# Patient Record
Sex: Male | Born: 1935 | Race: Black or African American | Hispanic: No | Smoking: Former smoker
Health system: Southern US, Community
[De-identification: ages and names within clinical notes are randomized; demographics above are authoritative.]

## PROBLEM LIST (undated history)

## (undated) DIAGNOSIS — I639 Cerebral infarction, unspecified: Secondary | ICD-10-CM

## (undated) DIAGNOSIS — N400 Enlarged prostate without lower urinary tract symptoms: Secondary | ICD-10-CM

## (undated) DIAGNOSIS — H409 Unspecified glaucoma: Secondary | ICD-10-CM

## (undated) DIAGNOSIS — I10 Essential (primary) hypertension: Secondary | ICD-10-CM

## (undated) DIAGNOSIS — J439 Emphysema, unspecified: Secondary | ICD-10-CM

## (undated) DIAGNOSIS — J449 Chronic obstructive pulmonary disease, unspecified: Secondary | ICD-10-CM

---

## 1998-12-03 ENCOUNTER — Ambulatory Visit (HOSPITAL_BASED_OUTPATIENT_CLINIC_OR_DEPARTMENT_OTHER): Admission: RE | Admit: 1998-12-03 | Discharge: 1998-12-03 | Payer: Self-pay | Admitting: Ophthalmology

## 1998-12-18 ENCOUNTER — Encounter: Payer: Self-pay | Admitting: Ophthalmology

## 1998-12-20 ENCOUNTER — Ambulatory Visit (HOSPITAL_COMMUNITY): Admission: RE | Admit: 1998-12-20 | Discharge: 1998-12-20 | Payer: Self-pay | Admitting: Ophthalmology

## 2000-04-02 ENCOUNTER — Ambulatory Visit (HOSPITAL_COMMUNITY): Admission: RE | Admit: 2000-04-02 | Discharge: 2000-04-02 | Payer: Self-pay | Admitting: Family Medicine

## 2000-04-02 ENCOUNTER — Encounter: Payer: Self-pay | Admitting: Family Medicine

## 2001-09-02 ENCOUNTER — Encounter: Payer: Self-pay | Admitting: Emergency Medicine

## 2001-09-02 ENCOUNTER — Emergency Department (HOSPITAL_COMMUNITY): Admission: EM | Admit: 2001-09-02 | Discharge: 2001-09-02 | Payer: Self-pay | Admitting: Emergency Medicine

## 2002-01-05 ENCOUNTER — Ambulatory Visit (HOSPITAL_COMMUNITY): Admission: RE | Admit: 2002-01-05 | Discharge: 2002-01-05 | Payer: Self-pay | Admitting: Family Medicine

## 2002-01-05 ENCOUNTER — Encounter: Payer: Self-pay | Admitting: Family Medicine

## 2003-04-05 ENCOUNTER — Encounter: Payer: Self-pay | Admitting: Family Medicine

## 2003-04-05 ENCOUNTER — Ambulatory Visit (HOSPITAL_COMMUNITY): Admission: RE | Admit: 2003-04-05 | Discharge: 2003-04-05 | Payer: Self-pay | Admitting: Family Medicine

## 2003-10-10 ENCOUNTER — Inpatient Hospital Stay (HOSPITAL_COMMUNITY): Admission: EM | Admit: 2003-10-10 | Discharge: 2003-10-15 | Payer: Self-pay | Admitting: Emergency Medicine

## 2003-10-10 ENCOUNTER — Encounter: Admission: RE | Admit: 2003-10-10 | Discharge: 2003-10-15 | Payer: Self-pay | Admitting: Neurology

## 2003-10-11 ENCOUNTER — Encounter: Payer: Self-pay | Admitting: Cardiology

## 2003-10-19 ENCOUNTER — Encounter: Admission: RE | Admit: 2003-10-19 | Discharge: 2003-11-14 | Payer: Self-pay | Admitting: Emergency Medicine

## 2003-12-25 ENCOUNTER — Encounter: Admission: RE | Admit: 2003-12-25 | Discharge: 2004-03-14 | Payer: Self-pay | Admitting: Pediatrics

## 2004-08-01 ENCOUNTER — Emergency Department (HOSPITAL_COMMUNITY): Admission: EM | Admit: 2004-08-01 | Discharge: 2004-08-01 | Payer: Self-pay | Admitting: Family Medicine

## 2004-12-26 ENCOUNTER — Ambulatory Visit (HOSPITAL_COMMUNITY): Admission: RE | Admit: 2004-12-26 | Discharge: 2004-12-26 | Payer: Self-pay | Admitting: Family Medicine

## 2005-12-23 ENCOUNTER — Ambulatory Visit (HOSPITAL_COMMUNITY): Admission: RE | Admit: 2005-12-23 | Discharge: 2005-12-23 | Payer: Self-pay | Admitting: Family Medicine

## 2006-05-04 ENCOUNTER — Emergency Department (HOSPITAL_COMMUNITY): Admission: EM | Admit: 2006-05-04 | Discharge: 2006-05-04 | Payer: Self-pay | Admitting: Family Medicine

## 2006-06-22 ENCOUNTER — Inpatient Hospital Stay (HOSPITAL_COMMUNITY): Admission: EM | Admit: 2006-06-22 | Discharge: 2006-06-24 | Payer: Self-pay | Admitting: Emergency Medicine

## 2007-05-13 ENCOUNTER — Ambulatory Visit: Payer: Self-pay | Admitting: Internal Medicine

## 2007-05-24 ENCOUNTER — Ambulatory Visit (HOSPITAL_COMMUNITY): Admission: RE | Admit: 2007-05-24 | Discharge: 2007-05-24 | Payer: Self-pay | Admitting: Internal Medicine

## 2007-07-19 ENCOUNTER — Ambulatory Visit: Payer: Self-pay | Admitting: Pulmonary Disease

## 2007-09-09 ENCOUNTER — Emergency Department (HOSPITAL_COMMUNITY): Admission: EM | Admit: 2007-09-09 | Discharge: 2007-09-09 | Payer: Self-pay | Admitting: Emergency Medicine

## 2007-09-20 ENCOUNTER — Ambulatory Visit: Payer: Self-pay | Admitting: Internal Medicine

## 2007-09-20 DIAGNOSIS — R0602 Shortness of breath: Secondary | ICD-10-CM

## 2007-09-20 DIAGNOSIS — E785 Hyperlipidemia, unspecified: Secondary | ICD-10-CM

## 2007-09-20 DIAGNOSIS — I10 Essential (primary) hypertension: Secondary | ICD-10-CM | POA: Insufficient documentation

## 2007-09-20 DIAGNOSIS — I639 Cerebral infarction, unspecified: Secondary | ICD-10-CM

## 2007-09-27 ENCOUNTER — Telehealth: Payer: Self-pay | Admitting: Internal Medicine

## 2007-10-04 ENCOUNTER — Telehealth (INDEPENDENT_AMBULATORY_CARE_PROVIDER_SITE_OTHER): Payer: Self-pay | Admitting: *Deleted

## 2007-10-25 ENCOUNTER — Telehealth (INDEPENDENT_AMBULATORY_CARE_PROVIDER_SITE_OTHER): Payer: Self-pay | Admitting: *Deleted

## 2007-11-08 ENCOUNTER — Ambulatory Visit: Payer: Self-pay | Admitting: Internal Medicine

## 2007-11-08 DIAGNOSIS — J441 Chronic obstructive pulmonary disease with (acute) exacerbation: Secondary | ICD-10-CM | POA: Insufficient documentation

## 2008-01-04 ENCOUNTER — Ambulatory Visit (HOSPITAL_COMMUNITY): Admission: RE | Admit: 2008-01-04 | Discharge: 2008-01-04 | Payer: Self-pay | Admitting: Family Medicine

## 2008-01-18 ENCOUNTER — Emergency Department (HOSPITAL_COMMUNITY): Admission: EM | Admit: 2008-01-18 | Discharge: 2008-01-18 | Payer: Self-pay | Admitting: Emergency Medicine

## 2008-05-08 ENCOUNTER — Encounter: Payer: Self-pay | Admitting: Internal Medicine

## 2008-06-05 ENCOUNTER — Emergency Department (HOSPITAL_COMMUNITY): Admission: EM | Admit: 2008-06-05 | Discharge: 2008-06-05 | Payer: Self-pay | Admitting: Emergency Medicine

## 2008-08-01 IMAGING — CR DG CHEST 2V
2 series · 2 of 2 positions shown · non-contrast
Comparison: 09/09/07.

CLINICAL DATA: Shortness of breath. 
 CHEST - 2 VIEW:

[w chest pa]
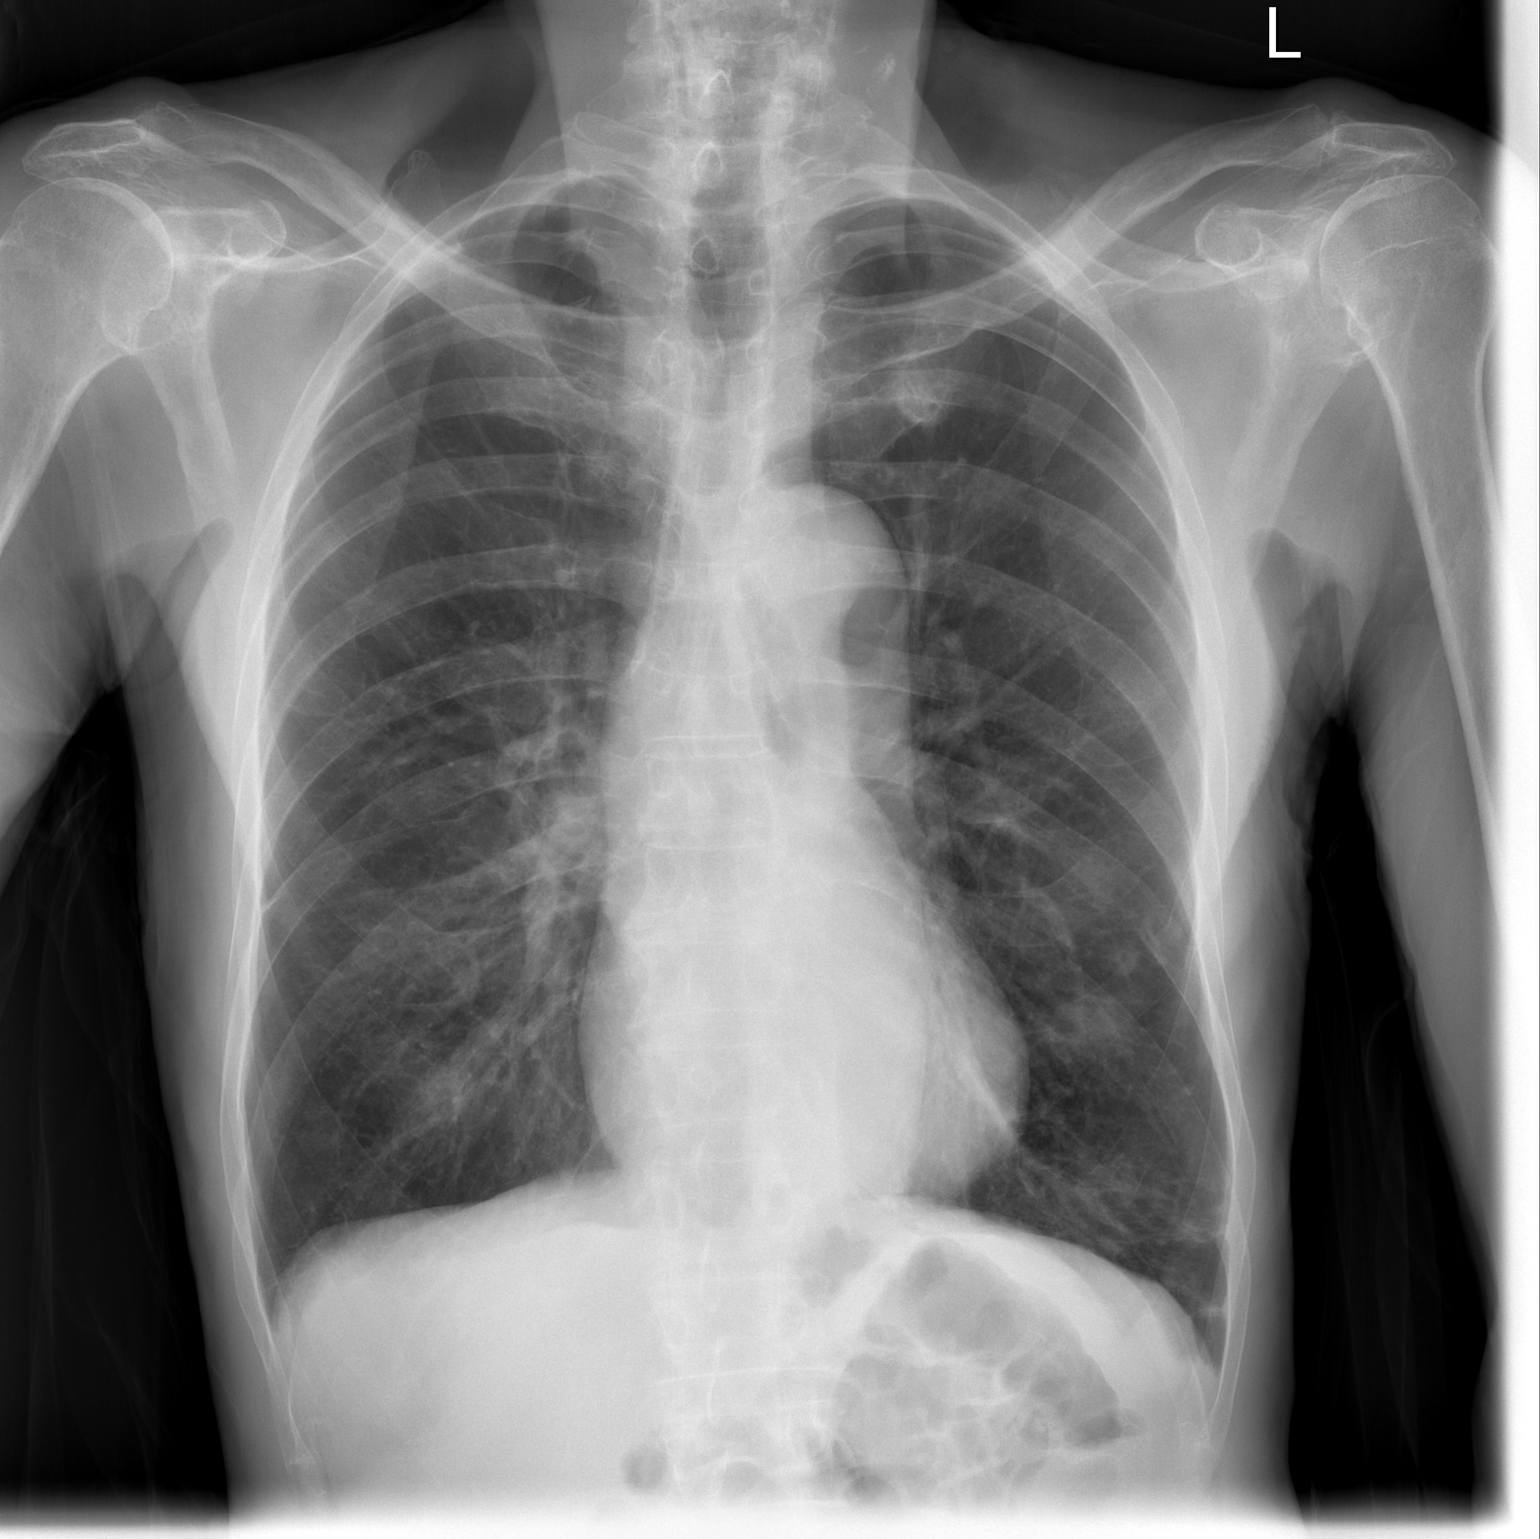

[w chest lat]
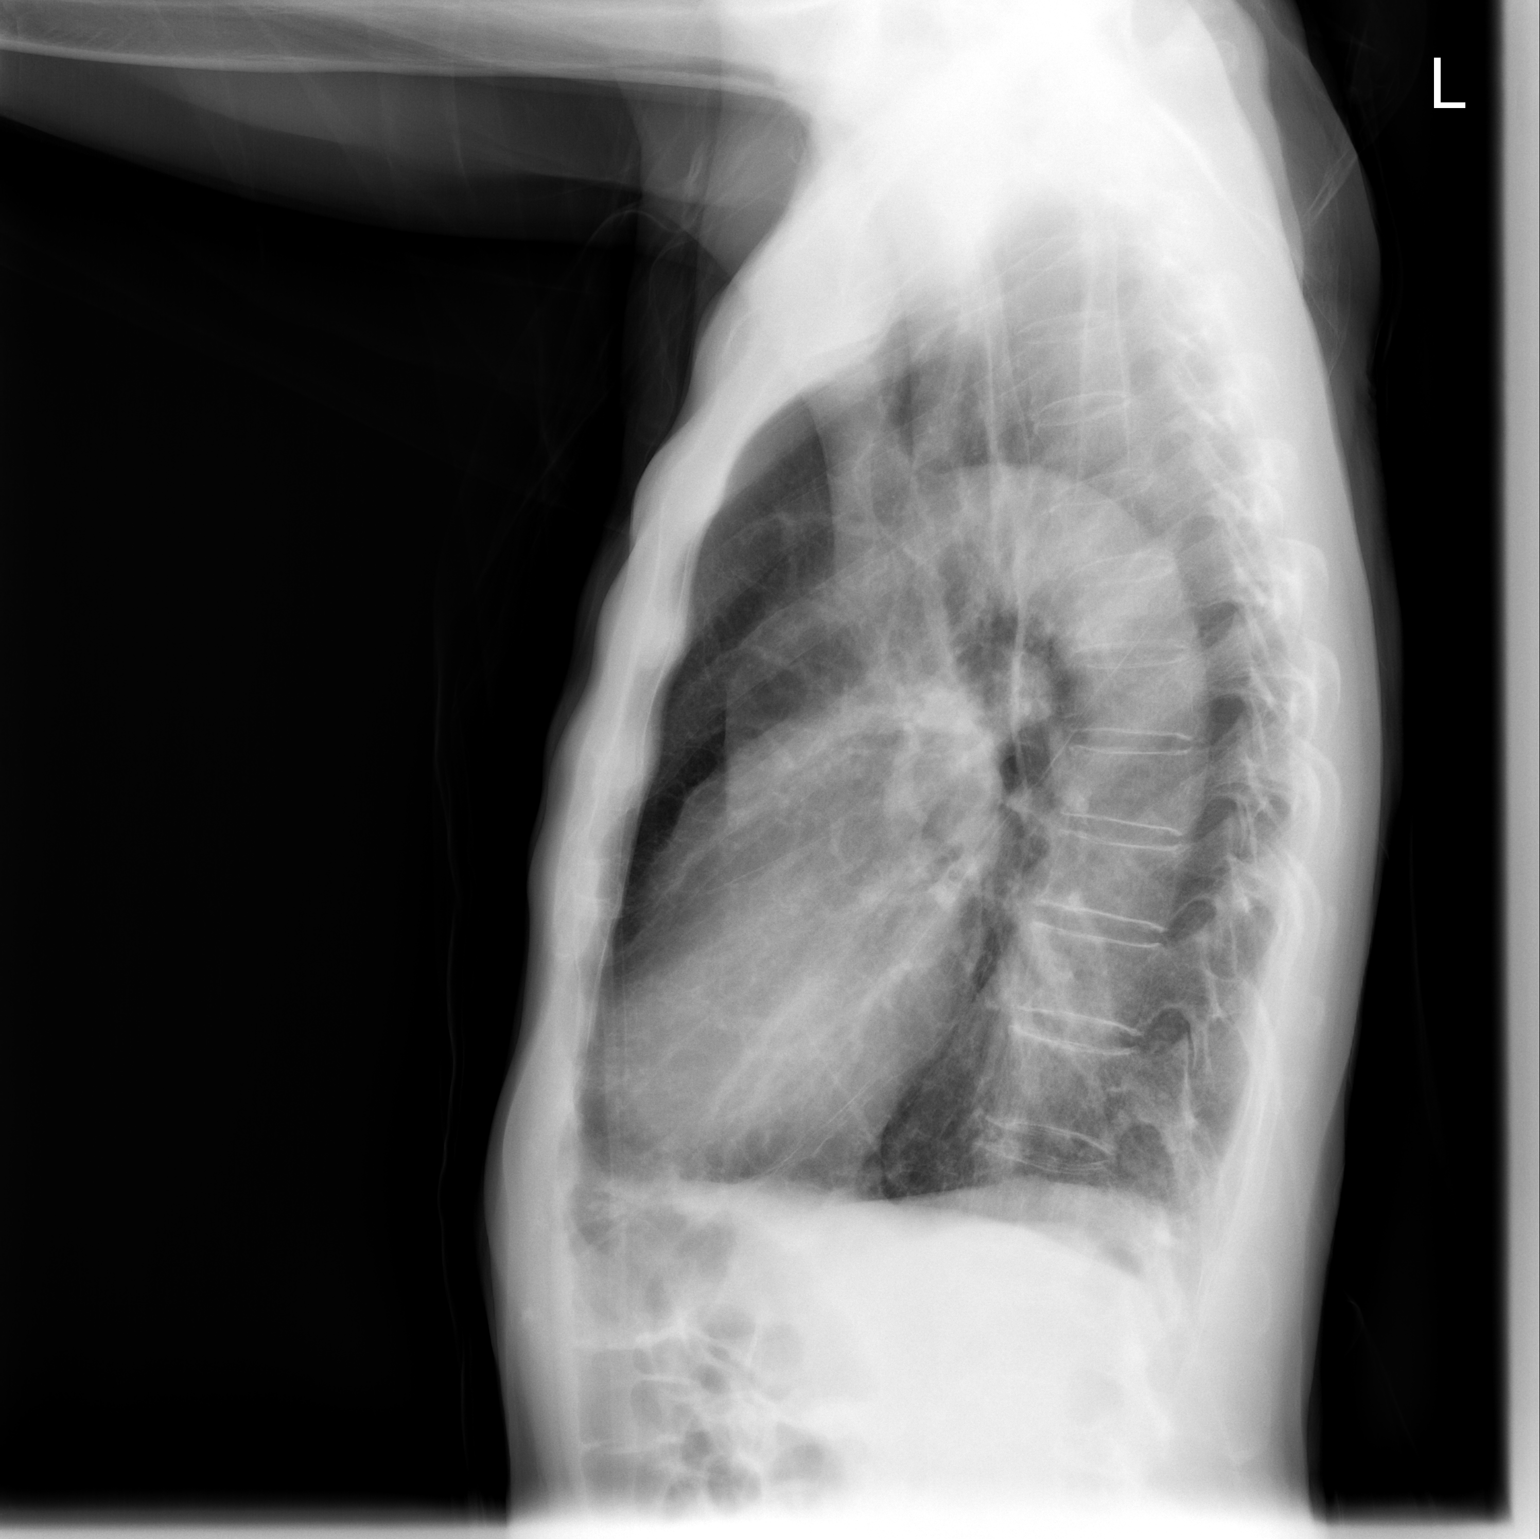

[2 of 2 positions shown; findings below may reference images not displayed]

FINDINGS: Changes of COPD are again noted.  Scarring is again seen in the left lung base.  There is no evidence of acute infiltrate or edema.  There is no evidence of pleural effusion.  Heart size normal.   No mass or adenopathy identified.
IMPRESSION: COPD and left lower lobe scarring.  No acute findings.

## 2008-12-19 ENCOUNTER — Ambulatory Visit (HOSPITAL_COMMUNITY): Admission: RE | Admit: 2008-12-19 | Discharge: 2008-12-19 | Payer: Self-pay | Admitting: Family Medicine

## 2009-08-26 ENCOUNTER — Emergency Department (HOSPITAL_COMMUNITY): Admission: EM | Admit: 2009-08-26 | Discharge: 2009-08-26 | Payer: Self-pay | Admitting: Emergency Medicine

## 2009-09-08 ENCOUNTER — Emergency Department (HOSPITAL_COMMUNITY): Admission: EM | Admit: 2009-09-08 | Discharge: 2009-09-08 | Payer: Self-pay | Admitting: Emergency Medicine

## 2009-11-22 ENCOUNTER — Emergency Department (HOSPITAL_COMMUNITY): Admission: EM | Admit: 2009-11-22 | Discharge: 2009-11-22 | Payer: Self-pay | Admitting: Emergency Medicine

## 2009-12-06 ENCOUNTER — Emergency Department (HOSPITAL_COMMUNITY): Admission: EM | Admit: 2009-12-06 | Discharge: 2009-12-06 | Payer: Self-pay | Admitting: Emergency Medicine

## 2010-02-21 ENCOUNTER — Emergency Department (HOSPITAL_COMMUNITY): Admission: EM | Admit: 2010-02-21 | Discharge: 2010-02-21 | Payer: Self-pay | Admitting: Emergency Medicine

## 2010-03-29 ENCOUNTER — Inpatient Hospital Stay (HOSPITAL_COMMUNITY): Admission: EM | Admit: 2010-03-29 | Discharge: 2010-03-31 | Payer: Self-pay | Admitting: Emergency Medicine

## 2010-12-29 LAB — CBC
HCT: 45.8 % (ref 39.0–52.0)
Hemoglobin: 12.8 g/dL — ABNORMAL LOW (ref 13.0–17.0)
Hemoglobin: 14.6 g/dL (ref 13.0–17.0)
MCHC: 31.1 g/dL (ref 30.0–36.0)
MCHC: 31.8 g/dL (ref 30.0–36.0)
MCHC: 32.3 g/dL (ref 30.0–36.0)
MCV: 90.7 fL (ref 78.0–100.0)
Platelets: 171 10*3/uL (ref 150–400)
Platelets: 173 10*3/uL (ref 150–400)
RBC: 5.05 MIL/uL (ref 4.22–5.81)
RDW: 15.2 % (ref 11.5–15.5)

## 2010-12-29 LAB — DIFFERENTIAL
Basophils Absolute: 0 10*3/uL (ref 0.0–0.1)
Basophils Relative: 0 % (ref 0–1)
Basophils Relative: 0 % (ref 0–1)
Eosinophils Absolute: 0.1 10*3/uL (ref 0.0–0.7)
Eosinophils Relative: 4 % (ref 0–5)
Lymphocytes Relative: 8 % — ABNORMAL LOW (ref 12–46)
Lymphs Abs: 0.8 10*3/uL (ref 0.7–4.0)
Monocytes Absolute: 0.1 10*3/uL (ref 0.1–1.0)
Monocytes Absolute: 0.4 10*3/uL (ref 0.1–1.0)
Monocytes Relative: 11 % (ref 3–12)
Monocytes Relative: 2 % — ABNORMAL LOW (ref 3–12)
Neutro Abs: 1.9 10*3/uL (ref 1.7–7.7)
Neutro Abs: 4.4 10*3/uL (ref 1.7–7.7)
Neutro Abs: 7 10*3/uL (ref 1.7–7.7)
Neutrophils Relative %: 80 % — ABNORMAL HIGH (ref 43–77)

## 2010-12-29 LAB — BASIC METABOLIC PANEL
CO2: 26 mEq/L (ref 19–32)
Chloride: 106 mEq/L (ref 96–112)
GFR calc Af Amer: >60 mL/min (ref >60)
Glucose, Bld: 129 mg/dL — ABNORMAL HIGH (ref 70–99)
Sodium: 141 mEq/L (ref 135–145)

## 2010-12-29 LAB — BLOOD GAS, ARTERIAL
Acid-Base Excess: 2.4 mmol/L — ABNORMAL HIGH (ref 0.0–2.0)
Bicarbonate: 26 mEq/L — ABNORMAL HIGH (ref 20.0–24.0)
O2 Content: 2 L/min
O2 Saturation: 95.7 %
Patient temperature: 98.6
pO2, Arterial: 73.6 mmHg — ABNORMAL LOW (ref 80.0–100.0)

## 2010-12-29 LAB — COMPREHENSIVE METABOLIC PANEL
ALT: 19 U/L (ref 0–53)
Albumin: 3.5 g/dL (ref 3.5–5.2)
Alkaline Phosphatase: 82 U/L (ref 39–117)
BUN: 23 mg/dL (ref 6–23)
CO2: 25 mEq/L (ref 19–32)
Calcium: 8.9 mg/dL (ref 8.4–10.5)
Chloride: 108 mEq/L (ref 96–112)
Creatinine, Ser: 1.29 mg/dL (ref 0.4–1.5)
GFR calc non Af Amer: 53 mL/min — ABNORMAL LOW (ref >60)
Glucose, Bld: 103 mg/dL — ABNORMAL HIGH (ref 70–99)
Glucose, Bld: 158 mg/dL — ABNORMAL HIGH (ref 70–99)
Potassium: 4.3 mEq/L (ref 3.5–5.1)
Sodium: 139 mEq/L (ref 135–145)
Total Bilirubin: 1.1 mg/dL (ref 0.3–1.2)
Total Protein: 7.2 g/dL (ref 6.0–8.3)

## 2010-12-29 LAB — LIPID PANEL
Cholesterol: 121 mg/dL (ref 0–200)
HDL: 96 mg/dL (ref >39)
LDL Cholesterol: 14 mg/dL (ref 0–99)
Total CHOL/HDL Ratio: 1.3 RATIO

## 2010-12-29 LAB — POCT CARDIAC MARKERS
CKMB, poc: 1.1 ng/mL (ref 1.0–8.0)
Myoglobin, poc: 88.7 ng/mL (ref 12–200)
Troponin i, poc: <0.05 ng/mL (ref 0.00–0.09)

## 2010-12-29 LAB — PROTIME-INR
INR: 1 (ref 0.00–1.49)
Prothrombin Time: 13.1 seconds (ref 11.6–15.2)

## 2010-12-29 LAB — MAGNESIUM: Magnesium: 2 mg/dL (ref 1.5–2.5)

## 2011-01-02 LAB — CBC
HCT: 42.7 % (ref 39.0–52.0)
MCHC: 33.1 g/dL (ref 30.0–36.0)
MCHC: 33.2 g/dL (ref 30.0–36.0)
MCV: 91.8 fL (ref 78.0–100.0)
RBC: 4.66 MIL/uL (ref 4.22–5.81)
RBC: 5.09 MIL/uL (ref 4.22–5.81)
RDW: 14.1 % (ref 11.5–15.5)

## 2011-01-02 LAB — URINALYSIS, ROUTINE W REFLEX MICROSCOPIC
Glucose, UA: NEGATIVE mg/dL
Specific Gravity, Urine: 1.014 (ref 1.005–1.030)
Urobilinogen, UA: 0.2 mg/dL (ref 0.0–1.0)

## 2011-01-02 LAB — BASIC METABOLIC PANEL
CO2: 24 mEq/L (ref 19–32)
Chloride: 102 mEq/L (ref 96–112)
Creatinine, Ser: 1.4 mg/dL (ref 0.4–1.5)
GFR calc Af Amer: 58 mL/min — ABNORMAL LOW (ref >60)
Potassium: 4.6 mEq/L (ref 3.5–5.1)

## 2011-01-02 LAB — COMPREHENSIVE METABOLIC PANEL
ALT: 18 U/L (ref 0–53)
AST: 24 U/L (ref 0–37)
Alkaline Phosphatase: 66 U/L (ref 39–117)
CO2: 29 mEq/L (ref 19–32)
Calcium: 9.5 mg/dL (ref 8.4–10.5)
GFR calc Af Amer: >60 mL/min (ref >60)
Potassium: 3.9 mEq/L (ref 3.5–5.1)
Sodium: 137 mEq/L (ref 135–145)
Total Protein: 7.6 g/dL (ref 6.0–8.3)

## 2011-01-02 LAB — DIFFERENTIAL
Basophils Relative: 0 % (ref 0–1)
Eosinophils Absolute: 0.1 10*3/uL (ref 0.0–0.7)
Eosinophils Absolute: 0.1 10*3/uL (ref 0.0–0.7)
Eosinophils Relative: 1 % (ref 0–5)
Eosinophils Relative: 1 % (ref 0–5)
Lymphs Abs: 0.7 10*3/uL (ref 0.7–4.0)
Monocytes Absolute: 0.5 10*3/uL (ref 0.1–1.0)
Monocytes Absolute: 0.6 10*3/uL (ref 0.1–1.0)
Monocytes Relative: 11 % (ref 3–12)
Monocytes Relative: 9 % (ref 3–12)
Neutrophils Relative %: 61 % (ref 43–77)

## 2011-01-02 LAB — URINE MICROSCOPIC-ADD ON

## 2011-01-02 LAB — URINE CULTURE

## 2011-01-02 LAB — GLUCOSE, CAPILLARY

## 2011-01-15 LAB — DIFFERENTIAL
Basophils Absolute: 0 10*3/uL (ref 0.0–0.1)
Lymphocytes Relative: 12 % (ref 12–46)
Monocytes Absolute: 0.6 10*3/uL (ref 0.1–1.0)
Monocytes Relative: 7 % (ref 3–12)
Neutro Abs: 6.7 10*3/uL (ref 1.7–7.7)
Neutrophils Relative %: 80 % — ABNORMAL HIGH (ref 43–77)

## 2011-01-15 LAB — CBC
HCT: 44.2 % (ref 39.0–52.0)
Hemoglobin: 14.9 g/dL (ref 13.0–17.0)
MCHC: 33.6 g/dL (ref 30.0–36.0)
MCV: 89.7 fL (ref 78.0–100.0)
Platelets: 211 10*3/uL (ref 150–400)
RDW: 14.7 % (ref 11.5–15.5)

## 2011-01-15 LAB — URINALYSIS, ROUTINE W REFLEX MICROSCOPIC
Glucose, UA: NEGATIVE mg/dL
Ketones, ur: NEGATIVE mg/dL
Protein, ur: NEGATIVE mg/dL
Urobilinogen, UA: 0.2 mg/dL (ref 0.0–1.0)

## 2011-01-15 LAB — URINE CULTURE

## 2011-01-15 LAB — COMPREHENSIVE METABOLIC PANEL
Albumin: 4.2 g/dL (ref 3.5–5.2)
BUN: 12 mg/dL (ref 6–23)
Creatinine, Ser: 1.08 mg/dL (ref 0.4–1.5)
Glucose, Bld: 127 mg/dL — ABNORMAL HIGH (ref 70–99)
Total Protein: 7.2 g/dL (ref 6.0–8.3)

## 2011-01-15 LAB — POCT CARDIAC MARKERS
CKMB, poc: 1.8 ng/mL (ref 1.0–8.0)
Myoglobin, poc: 121 ng/mL (ref 12–200)
Troponin i, poc: <0.05 ng/mL (ref 0.00–0.09)

## 2011-01-15 LAB — URINE MICROSCOPIC-ADD ON

## 2011-01-23 LAB — COMPREHENSIVE METABOLIC PANEL
ALT: 16 U/L (ref 0–53)
AST: 26 U/L (ref 0–37)
Albumin: 4.2 g/dL (ref 3.5–5.2)
Alkaline Phosphatase: 69 U/L (ref 39–117)
Chloride: 104 mEq/L (ref 96–112)
GFR calc Af Amer: >60 mL/min (ref >60)
Potassium: 3.5 mEq/L (ref 3.5–5.1)
Sodium: 141 mEq/L (ref 135–145)
Total Bilirubin: 2.4 mg/dL — ABNORMAL HIGH (ref 0.3–1.2)
Total Protein: 7.2 g/dL (ref 6.0–8.3)

## 2011-01-23 LAB — CBC
HCT: 44.7 % (ref 39.0–52.0)
Platelets: 154 10*3/uL (ref 150–400)
RDW: 13.7 % (ref 11.5–15.5)
WBC: 4.2 10*3/uL (ref 4.0–10.5)

## 2011-01-23 LAB — LIPID PANEL
LDL Cholesterol: 42 mg/dL (ref 0–99)
Total CHOL/HDL Ratio: 1.8 RATIO
VLDL: 14 mg/dL (ref 0–40)

## 2011-01-23 LAB — TSH: TSH: 1.284 u[IU]/mL (ref 0.350–4.500)

## 2011-02-25 NOTE — Assessment & Plan Note (Signed)
[facility]                             PULMONARY OFFICE NOTE   Jesus Carey, Jesus Carey                      MRN:          [id number]  DATE:05/13/2007                            DOB:          [birth year]    PROBLEM:  This is an 77 year old man referred through the courtesy of  Dr. Bruna Potter in pulmonary consultation concerned about shortness of  breath.   HISTORY:  He comes with his granddaughter and together they say that he  has shortness of breath which varies from day-to-day, but is made worse  by current hot weather. There is usually some cough and white sputum,  but it does not wake him. There is no chest pain. He has been using an  inhaler every two weeks over the past few months, and was hospitalized  for breathing problems at [facility] earlier in the year. They gave him  Advair in a metered inhaler, but he stopped using the Advair.   MEDICATIONS:  1. Aspirin 81 mg.  2. Lisinopril/hydrochlorothiazide 20/12.5.  3. Amlodipine 5 mg.  4. Crestor 10 mg.  5. Plavix 75 mg.  6. Xalatan eye drops.  7. Albuterol inhaler p.r.n.   ALLERGIES:  No medication allergy.   REVIEW OF SYSTEMS:  Dyspnea at rest and with exertion. No chest pain.  Weight has been stable. No bloody or purulent sputum. Some joint  stiffness, non-specific. No adenopathy or rash. Feet do not swell.   PAST HISTORY:  Hypertension, elevated cholesterol, stroke. He denies any  history of pneumonia, tuberculosis exposure, operations, or anemia. No  heart disease.   SOCIAL HISTORY:  He quit smoking 20 years ago. He is widowed. He lives  alone. 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   FAMILY HISTORY:  He is not aware of detail, but says that nobody has  significant breathing problems.   OBJECTIVE:  VITAL SIGNS:  Weight 96 pounds, blood pressure 102/66, pulse  97, room air saturation 95%.  GENERAL:  This is a slim, comfortable appearing,  elderly man.  SKIN:  No rash.  ADENOPATHY:  None found.  HEENT:  Mild rhonchi across the mid-zones, unlabored without cough or  wheeze.  HEART:  Sounds are regular without murmur or gallop.  ABDOMEN:  I cannot feel liver or spleen.  EXTREMITIES:  There is no cyanosis, clubbing, or peripheral edema.   IMPRESSION:  This is probably chronic obstructive pulmonary disease,  complicated by age related weakness, but I am concerned that he is using  up an albuterol inhaler every 2 weeks which is too fast for that drug.   PLAN:  1. We discussed the appropriate use of albuterol and side effects.  2. He is given a trial of Spiriva once daily.  3. We are scheduling chest x-ray and pulmonary function tests.  4. Schedule return in 1 month, earlier p.r.n.     Clinton D. Maple Hudson, MD, Tonny Bollman, FACP  Electronically Signed    CDY/MedQ  DD: 05/15/2007  DT: 05/16/2007  Job #: [id number]   cc:   Britt Bottom  Bruna Potter, MD

## 2011-02-25 NOTE — Assessment & Plan Note (Signed)
[facility]                             PULMONARY OFFICE NOTE   CARRINGTON, OLAZABAL                      MRN:          [id number]  DATE:09/20/2007                            DOB:          1920-02-28    HISTORY OF PRESENT ILLNESS:  The patient is an 77 year old African  American male, a patient of Dr. Maple Hudson, who has a known history of  moderate to severe COPD that presents today for followup.  The patient  was seen in the office 2 months ago with persistent cough.  The patient  had been recommended to change over from his ACE inhibitor to Benicar.  The patient reports that his cough did improve.  The patient reports his  breathing actually improved on Symbicort; however, the patient did not  follow up for return on 2 weeks as recommended.  The patient did feel  like the Symbicort did help his breathing quite a bit and actually had  decreased his albuterol use.  Recently the patient had a flare up 2  weeks ago on Thanksgiving day and was seen in the emergency room for an  exacerbation.  He was given a prednisone pack and a nebulizer treatment.  The patient reports symptoms did improve.  And, now he is back to his  baseline in which he has shortness of breath with activity and uses  albuterol several times a day.  The patient denies any chest pain,  palpitations, orthopnea, PND, or leg swelling.   PAST MEDICAL HISTORY:  Reviewed.   CURRENT MEDICATIONS:  Reviewed.   PHYSICAL EXAMINATION:  GENERAL:  The patient is a very thin black male  in no acute distress.  VITAL SIGNS:  He is afebrile with stable vital signs.  O2 saturation is  98% on room air.  HEENT:  Unremarkable.  NECK:  Supple without cervical adenopathy.  No JVD.  RESPIRATORY:  Lung sounds reveal a few scattered rhonchi.  CARDIAC:  Regular rate and rhythm.  ABDOMEN:  Soft and nontender.  EXTREMITIES:  Warm without any edema.   IMPRESSION:  Moderate to severe chronic obstructive pulmonary  disease  with an asthmatic bronchitic component.   PLAN:  1. The patient is recommended to restart Symbicort 80/4.5 two puffs      twice a day, sample and prescription were given.  Instructions were      given to the patient and his daughter today.  2. The patient also is given Ventolin HFA inhaler to have on use with      the instructions that we      hope to decrease the use of rescue inhaler requirements.  3. The patient will return back with Dr. Maple Hudson in 1 month or sooner if      needed.      Rubye Oaks, NP  Electronically Signed      Clinton D. Maple Hudson, MD, Tonny Bollman, FACP  Electronically Signed   TP/MedQ  DD: 09/20/2007  DT: 09/20/2007  Job #: [id number]

## 2011-02-25 NOTE — Assessment & Plan Note (Signed)
[facility]                             PULMONARY OFFICE NOTE   ORLANDER, NORWOOD                      MRN:          [id number]  DATE:07/19/2007                            DOB:          1920/07/30    HISTORY OF PRESENT ILLNESS:  The patient is an 77 year old African  American male, patient of Dr. Maple Hudson, who was recently seen in July, for  a pulmonary consult for persistent shortness of breath.  The patient was  felt to have probable COPD, was set up for pulmonary function tests  which revealed an FEV1 of 1.04 liters which was 52% of predicted.  The  patient was recommended to use Spiriva.  However, the patient only took  his sample dose and has not taken any since.  The patient continues to  have increased shortness of breath with activity, persistent cough and  wheezing.  The patient denies any purulent sputum, hemoptysis,  orthopnea, PND, or leg swelling.  The patient did quit smoking  approximately 20 years ago.  A chest x-ray showed COPD changes without  acute infiltrate or effusion.   PAST MEDICAL HISTORY:  Reviewed.   CURRENT MEDICATIONS:  Reviewed.   PHYSICAL EXAMINATION:  GENERAL:  The patient is a very thin, frail,  elderly male in no acute distress.  VITAL SIGNS:  He is afebrile with stable vital signs.  O2 saturation is  98% on room air.  HEENT:  Unremarkable.  NECK:  Supple without cervical adenopathy.  No JVD.  RESPIRATORY:  Lung sounds reveal faint expiratory wheezes with decreased  breath sounds at the bases.  The patient has significant upper airway  pseudo-wheezing.  CARDIAC:  Regular rate and rhythm.  ABDOMEN:  Soft, nontender.  No palpable hepatosplenomegaly.  EXTREMITIES:  Warm without any calf tenderness, cyanosis, clubbing, or  edema.   DATA:  PFTs on May 24, 2007, revealed an FEV1 of 1.04 liters which  was 52% of predicted.  Post bronchodilator was 1.17 liters which showed  a 13% change.  The patient had decreased  mid flows at 27% and a  diffusing capacity at 31%.   IMPRESSION AND PLAN:  Moderate to severe chronic obstructive pulmonary  disease with persistent cough.  The patient is recommended to begin  Symbicort 80/4.5 mcg two puffs twice a day, add Mucinex DM twice a day.  The patient is on an ACE inhibitor which could be irritating his airway  and causing persistent cough.  I have recommended that he stop his  Lisinopril and  begin Benicar HCT 20/12.5 mg one by mouth daily.  The patient will  return back here in 2 weeks or sooner if needed.      Rubye Oaks, NP  Electronically Signed      Clinton D. Maple Hudson, MD, Tonny Bollman, FACP  Electronically Signed   TP/MedQ  DD: 07/19/2007  DT: 07/19/2007  Job #: [id number]

## 2011-02-28 NOTE — Discharge Summary (Signed)
NAME:  Jesus Carey, Jesus Carey NO.:  [id number]   MEDICAL RECORD NO.:  [id number]          PATIENT TYPE:  INP   LOCATION:  6733                         FACILITY:  [facility]   PHYSICIAN:  Mobolaji B. Bakare, M.D.DATE OF BIRTH:  May 15, 1920   DATE OF ADMISSION:  06/22/2006  DATE OF DISCHARGE:  06/24/2006                                 DISCHARGE SUMMARY   PRIMARY CARE PHYSICIAN:  Renaye Rakers, M.D.   FINAL DIAGNOSES:  1. Chronic obstructive pulmonary disease exacerbation.  2. Hypertension.  3. Hyperlipidemia.  4. Old left brain stroke.  5. Mild thrombocytopenia.   PROCEDURES:  Chest x-ray compatible with COPD, no active cardiopulmonary  disease.   BRIEF HISTORY:  [name] is a pleasant 77 year old African-American male  who resides alone and has been functioning quite well at home.  He is  independent.  He has a history of COPD but has not been using inhalers and  the patient developed acute onset of shortness of breath on the day of  admission.  He called EMS.  He was given nebulizer treatment en route to the  hospital, IV steroid, and this has improved significantly on arrival in the  emergency room.  He has been having chronic nagging cough.  There was no  fever or chills.  Cough not productive of sputum.   Initial vitals on admission:  Blood pressure 146/93, heart rate of 82,  respiratory rate of 30, O2 saturation of 98% on 2 L.  On examination, the  patient was not in respiratory distress.  Lung exam was remarkable for  reduced breath sounds throughout both lung fields but no active wheeze.  The  rest of physical exam was unremarkable.   The patient was admitted for COPD exacerbation, nebulizer treatment.   HOSPITAL COURSE:  Problem 1.  CHRONIC OBSTRUCTIVE PULMONARY DISEASE EXACERBATION:  Apparently  from history, Mr. Severin appears to have been stable for some time with  respect to the COPD.  He has not needed to use his albuterol MDI in recent  times.  The  patient improved remarkably within the first 24 hours since  evaluation in the emergency room.  He was admitted to ensure stability and  he was placed on Advair Diskus one puff b.i.d. and Spiriva 18 mcg daily.  He  was also nebulized with albuterol and Atrovent p.r.n.  The patient remained  afebrile and white cell count is normal.  He is deemed fit to be discharged  home on this medications.  He was not treated with IV steroid nor  antibiotic.   Problem 2.  HYPERTENSION:  Blood pressure was uncontrolled just prior to  initiating 5 mg of Norvasc.  His home dose of Norvasc is uncertain.  The  patient was on lisinopril at home; however, that dose is unknown and his  blood pressure was elevated while only on Norvasc; hence, Avapro was given  to control blood pressure.  On discharge the patient will continue with same  dose of lisinopril as he was using before.  Blood pressure on admission  142/78.  He still has room to accommodate  Norvasc 10 mg daily should this be  his home dose of medication.   Problem 3.  MILD THROMBOCYTOPENIA:  Platelets on admission was 150.  This  dropped slightly to 140s.  Lovenox for DVT prophylaxis was held.  He was  placed on PAS hose and platelets at the time of discharge was 138.  Follow-  up CBC to be done in outpatient setting by primary care physician.   The patient was seen by physical and occupational therapy.  He did not need  any of their services.  The patient ambulating well without need to stop to  catch his breath.  His oxygen did not drop during ambulation.   The patient lives alone.  He appears to be comfortable returning home.  He  has been independent.  There is some concern about his medications and being  able to use them appropriately.  I have arranged home health RN to follow up  with patient to ensure safety and medication use.   DISCHARGE MEDICATIONS:  1. Advair one puff b.i.d.  2. Megace 200 mg daily.  3. Spiriva 18 mcg daily.  4.  Albuterol inhaler as needed.  5. Lisinopril to use as before.  6. Norvasc 5 mg daily.  7. Aspirin 81 mg daily.  8. Crestor to use as before.  9. Plavix 75 mg daily.   DISCHARGE CONDITION:  Stable.  Vitals:  Temperature 97.9, heart rate 68,  blood pressure 142/78, O2 saturation was 97% on room air.   Follow up with Dr. Renaye Rakers next week.  Home health RN has been arranged  to follow up the patient.  Check CBC in one week.      Mobolaji B. Corky Downs, M.D.  Electronically Signed     MBB/MEDQ  D:  06/24/2006  T:  06/24/2006  Job:  [id number]   cc:   Renaye Rakers, M.D.

## 2011-02-28 NOTE — H&P (Signed)
NAME:  Jesus Carey, Jesus Carey NO.:  [id number]   MEDICAL RECORD NO.:  [id number]          PATIENT TYPE:  EMS   LOCATION:  MAJO                         FACILITY:  [facility]   PHYSICIAN:  Lonia Blood, M.D.DATE OF BIRTH:  April 27, 1920   DATE OF ADMISSION:  06/22/2006  DATE OF DISCHARGE:                                HISTORY & PHYSICAL   PRIMARY CARE PHYSICIAN:  Unassigned.   CHIEF COMPLAINT:  Shortness of breath.   HISTORY OF PRESENT ILLNESS:  Mr. Lanard Arguijo is a very pleasant 77-year-  old male who lives alone here in [place].  He has a longstanding history  of COPD per old records.  He apparently did discontinue tobacco abuse  approximately 20 years ago.  Mr. Faivre was in his usual state of health,  when he suffered the acute onset of severe shortness of breath this evening.  He reports that he does have frequent spells at home of difficulty with his  breathing, but they usually resolve on their own.  He states that he was  given an albuterol MDI years ago, but that he has never used it at home.  It appears that he has little insight into his illness.  Tonight, however,  his shortness of breath became worrisome enough that he summoned EMS for  evaluation.  EMS found the patient to be in acute respiratory distress.  He  was given a nebulizer treatment on the way to the hospital and then in the  emergency room, he required IV magnesium, high-dose IV steroids and a 10-mg  continuous albuterol nebulizer to break his severe bronchospasm.  At present  he reports that his breathing feels better.  He denies fever or chills.  He  does admit to a chronic nagging dry cough.  He admits to weight loss since  his wife died approximately 2 years ago.  He reports that he has a good  appetite and that he does eat, though this is questionable.  He denies  hemoptysis.   REVIEW OF SYSTEMS:  Comprehensive review of systems is unremarkable with the  exception of the positive  elements noted in the history of present illness  above.   PAST MEDICAL HISTORY:  (Per old records.)  1. Hypertension.  2. Hyperlipidemia.  3. COPD.  4. Remote history of tobacco abuse, discontinued greater than 20 years      ago.  5. History of left brain CVA, January 2005, with no long-term deficits      with the exception to mild right hand clumsiness and loss of fine motor      movement in the right hand.  6. Glaucoma.   OUTPATIENT MEDICATIONS:  1. Norvasc 5 mg daily.  2. Aspirin daily.  3. Crestor -- dose unknown.  4. Lisinopril -- dose unknown.  5. Plavix 75 mg daily.   ALLERGIES:  No known drug allergies.   FAMILY HISTORY:  Noncontributory secondary to advanced age.   SOCIAL HISTORY:  The patient does not drink.  He does not currently work.  He states that he has done a whole bunch of different  jobs.  He denies  prolonged exposure to cotton dust.  He presently lives alone; he is a  widower.   DATA REVIEW:  Hemoglobin, white count, platelet count and MCV are normal.  Sodium, potassium, chloride, bicarb, BUN and creatinine are normal.  Serum  glucose is elevated at 121.  The pH is 7.33, PCO2 is 43, PO2 was not  obtained on a venous gas.   Chest x-ray reveals definite change consistent with COPD, but no evidence of  a focal infiltrate.   PHYSICAL EXAM:  VITAL SIGNS:  Afebrile, blood pressure 146/93, heart rate  82, respiratory rate 30, O2 SAT 98% on 2 L/min nasal cannula.  GENERAL:  A thin, frail-appearing male who is finishing up a continuous  albuterol treatment and appears to be breathing comfortably at the present  time.  HEENT:  Normocephalic, atraumatic.  Pupils equal, round and reactive to  light and accommodation.  NECK:  No JVD.  LUNGS:  Markedly distant breath sounds throughout all fields with barely  audible inspiratory sounds, but no active wheeze at the present time.  CARDIOVASCULAR:  Markedly distant heart sounds without gallop or rub and no   appreciable murmur.  ABDOMEN:  Thin, soft.  Bowel sounds present.  No mass.  No rebound.  EXTREMITIES:  No significant cyanosis, clubbing or edema in bilateral lower  extremities.  NEUROLOGIC:  Alert and oriented, though slow to respond to some questions  and somewhat lethargic, moves all 4 extremities spontaneously.   IMPRESSION AND PLAN:  1. Acute chronic obstructive pulmonary disease exacerbation:  The exact      etiology of Mr. Farve acute chronic obstructive pulmonary disease      exacerbation is not clear.  While it does appear that he has resolved      his acute symptoms for the present time, I feel that he is at      exceedingly high risk to be returned to a home setting where he lives      alone.  I am concerned about this gentleman's ability to live on his      own to begin with and feel that to send him home now at such a high      risk for recurrence of his bronchospasm would significantly endanger      his life.  I will therefore admit him to the hospital for further      evaluation.  I will continue inhaled beta agonists as well as Atrovent.      We will use inhaled steroids.  I will not continue systemic steroid, as      it does not appear to be required at the present time.  After the      patient is hydrated, we will follow up a chest x-ray to assure there is      no new evidence of an infiltrate.  The patient is no longer smoking.  2. Hyperglycemia:  The patient has an elevated capillary blood glucose at      121.  He has no prior history of diabetes mellitus.  This may be      related to medications given in the emergency room.  We will check a      capillary blood glucose on a twice-daily basis initially and follow the      trend.  We will also check a hemoglobin A1c.  3. Hypertension:  The patient's blood pressure is currently reasonably      well-controlled.  We will  continue his Norvasc and lisinopril and     follow his vitals closely.  4. Chronic obstructive  pulmonary disease:  It appears that the patient has      very poor understanding of his illness.  After his acute phase is      confirmed to have resolved, we will need to place him on standing      medications.  I would favor Advair for this gentleman.  We will need to      educate him on his illness and assure that he is capable of      administering his own medications.  5. Failure to thrive:  Mr. Rawles reports that he has been continually      losing weight for 2 years.  He has somewhat of a dry cough.  I will      check an albumin on this gentleman, as I am concerned about protein      calorie malnutrition.  If it is low, we will need to consider CT scan      of the chest to rule out an occult mass in the setting of a remote      history of tobacco abuse and otherwise unexplained weight loss in a      gentleman with a dry      persistent cough.  Otherwise, I will also consult Physical Therapy,      Occupational Therapy and Case Management to see if we can assist this      gentleman in his current living situation.  We may need to consider an      alternate living arrangement to best suit Mr. Mcconathy.      Lonia Blood, M.D.  Electronically Signed     JTM/MEDQ  D:  06/22/2006  T:  06/22/2006  Job:  [id number]

## 2011-02-28 NOTE — Procedures (Signed)
This is a 17-channel EEG with one channel devoted to EKG utilizing  international 10/20 lead placement.  The patient was described clinically as  being awake and drowsy.  Scintigraphically appeared to be awake throughout  the study.  The study is being done to evaluate a left brain stroke.  He was  having some focal jerking on the right hand, rule out seizure related to  this.  There is interhemispheric asymmetry with relative slowing in the left  hemisphere with high theta, low alpha, 6-8 Hz range activity there.  The  background in the right hemisphere is faster with a 10 Hz alpha and much  better organized, well developed, and well modulated.  No seizure activity  or pled-like activity is seen in the left hemisphere, however.  Activation  procedures were not performed.  This was a portable study.  The EKG monitor  reveals a relatively regular rhythm with a rate of about 78 beats per  minute, although frequent PVCs are noted.   ASSESSMENT:  Abnormal electroencephalography demonstrating mild left-sided  slowing compatible with the patient's known left brain stroke.  No pled-like  activity or seizure-like activity is seen, however.  PVCs are noted on the  EKG monitor.  Clinical correlation is recommended.    Tyler Deis, M.D.   [id number]  D:  30/09/2003 17:44:25  T:  30/09/2003 18:07:38  Job #:  [id number]

## 2011-02-28 NOTE — Discharge Summary (Signed)
NAME:  Jesus Carey, Jesus Jesus Carey NO.:  [id number]   MEDICAL RECORD NO.:  [id number]          PATIENT TYPE:  INP   LOCATION:  6733                         FACILITY:  [facility]   PHYSICIAN:  Mobolaji B. Bakare, M.D.DATE OF BIRTH:  20-Dec-1919   DATE OF ADMISSION:  06/22/2006  DATE OF DISCHARGE:  06/24/2006                                 DISCHARGE SUMMARY   PRIMARY CARE PHYSICIAN:  Dr. Renaye Rakers.   FINAL DIAGNOSES:  1. Chronic obstructive pulmonary disease exacerbation.  2. Hypertension.  3. Hyperlipidemia.  4. Mild thrombocytopenia.  5. History of left brain cerebrovascular accident in 2005.  6. Glaucoma.   PROCEDURE:  Chest x-ray showed changes compatible with COPD, no acute  cardiopulmonary disease.   BRIEF HISTORY:  [name] is an 77 year old African American male with  history of COPD.  He presented to the emergency room with shortness of  breath, which started acutely the evening before.  Patient has been on MDI  albuterol.   DICTATION ENDED AT THIS POINT.      Mobolaji B. Corky Downs, M.D.  Electronically Signed     MBB/MEDQ  D:  06/24/2006  T:  06/24/2006  Job:  [id number]

## 2011-02-28 NOTE — Discharge Summary (Signed)
NAME:  EGE, MUCKEY NO.:  [id number]   MEDICAL RECORD NO.:  [id number]                   PATIENT TYPE:  INP   LOCATION:  3037                                 FACILITY:  [facility]   PHYSICIAN:  Gustavus Messing. Orlin Hilding, M.D.          DATE OF BIRTH:  1920-02-09   DATE OF ADMISSION:  DATE OF DISCHARGE:  10/15/2003                                 DISCHARGE SUMMARY   ADMITTING DIAGNOSIS:  Left-brain stroke.   DISCHARGE DIAGNOSIS:  Left-brain stoke secondary to left ICA stenosis.   ADDITIONAL DIAGNOSES:  1. Glaucoma.  2. Hypertension.  3. Cardiomyopathy.   MEDICATIONS ON DISCHARGE:  1. Plavix 75 mg one a day for stroke prevention.  2. Resume Norvasc 5 mg one a day.  3. Lisinopril.  4. Hydrochlorothiazide 20/12.5 mg one a day.  5. Cosopt one drop each eye daily.   PROCEDURES PERFORMED:  1. CT scan of the brain on October 13, 2003, which showed left frontal lobe     small to slightly-moderate sized infarct which is sub gyral on the left     across the frontal region.  2. CT angio showed calcific plaque at the origin of the left ICA, narrowing     limited by approximately 80%.  No significant right carotid stenosis.  3. Chest x-ray showed stable, mild changes of chronic obstructive pulmonary     disease.  4. EKG showed normal sinus rhythm with occasional PVC's.   LABORATORY DATA:  On October 13, 2003, he had a normal CBC.  He had a  minimally low potassium which was supplemented at 3.1 on December 30.  When  he was admitted, it was normal at 4.0.  Glucose was 105.  Protein slightly  low at 5.7.  Albumin 3.2.  Bilirubin slightly elevated at 1.9.  On  admission, it was also slightly elevated at 1.4.  Hemoglobin A1c was normal  at 5.8.  Homocystine 13.72.  Cholesterol was 153 with an LDL of only 75.  The HDL of 57.  Both within preferred levels.  Triglycerides  104.   DISCHARGE CONDITION:  The patient is discharged ambulatory with a right  upper  extremity monoparesis affecting the distal greater than proximal right  upper extremity.  Normal language and cognition.   FOLLOWUP:  He needs physical therapy and occupational therapy as an  outpatient at [facility]. Will need to call for an  appointment on Monday. Otherwise, should gradually resume normal activities  as tolerated.  He will need 24-hour live-in assistance.  He needs to follow  up with Dr. Sharene Skeans as instructed for participation in the __________  II  study.  Otherwise, call Dr. Ferne Coe office at [phone] for an appointment  in Three to four weeks.  Follow up with primary physician as needed.   HOSPITAL COURSE:  Please refer to the history and physical for details.  Briefly, this is an 77 year old man who presented  with right-sided weakness,  within a time frame consistent with use of tPA.  He was given tPA and also  agreed to participate in the __________  II neuro protective stroke  protocol.  Workup as above indicated that he had a significant stenosis at  the high end of the 60 to 80% range in the left ICA.  He then had a CT  angiogram which confirmed this, although both surgery and stenting were  discussed enough to the patient along with pros and cons, he steadfastly  declined to have any treatment done, and therefore will be discharged on  antiplatelet therapy, and physical therapy.                                                Catherine A. Orlin Hilding, M.D.    CAW/MEDQ  D:  10/15/2003  T:  10/15/2003  Job:  [id number]

## 2011-04-09 ENCOUNTER — Ambulatory Visit (INDEPENDENT_AMBULATORY_CARE_PROVIDER_SITE_OTHER): Payer: Medicare Other

## 2011-04-09 ENCOUNTER — Inpatient Hospital Stay (INDEPENDENT_AMBULATORY_CARE_PROVIDER_SITE_OTHER)
Admission: RE | Admit: 2011-04-09 | Discharge: 2011-04-09 | Disposition: A | Discharge status: Home / Self Care | Payer: Medicare Other | Source: Ambulatory Visit | Attending: Emergency Medicine | Admitting: Emergency Medicine

## 2011-04-09 DIAGNOSIS — M659 Synovitis and tenosynovitis, unspecified: Secondary | ICD-10-CM

## 2011-07-07 LAB — COMPREHENSIVE METABOLIC PANEL
ALT: 11
AST: 24
Alkaline Phosphatase: 80
CO2: 26
Calcium: 9.7
GFR calc Af Amer: >60
GFR calc non Af Amer: >60
Potassium: 3.9
Sodium: 138
Total Protein: 7.3

## 2011-07-07 LAB — CBC
Hemoglobin: 13.9
MCHC: 33.2
RBC: 4.71
WBC: 6.9

## 2011-07-07 LAB — TSH: TSH: 1.784

## 2011-07-07 LAB — LIPID PANEL
HDL: 66
Total CHOL/HDL Ratio: 1.9

## 2011-07-08 LAB — DIFFERENTIAL
Basophils Absolute: 0
Basophils Relative: 0
Eosinophils Absolute: 0
Eosinophils Relative: 1
Lymphocytes Relative: 23
Lymphs Abs: 1.6
Monocytes Absolute: 0.5
Monocytes Relative: 7
Neutro Abs: 5.1
Neutrophils Relative %: 70

## 2011-07-08 LAB — CBC
HCT: 42.4
Hemoglobin: 13.9
MCHC: 32.8
MCV: 89.1
Platelets: 208
RBC: 4.75
RDW: 14.3
WBC: 7.2

## 2011-07-08 LAB — BASIC METABOLIC PANEL
Calcium: 9
Creatinine, Ser: 1.03
GFR calc Af Amer: >60
Sodium: 138

## 2011-07-08 LAB — BASIC METABOLIC PANEL WITH GFR
BUN: 21
CO2: 25
Chloride: 102
GFR calc non Af Amer: >60
Glucose, Bld: 157 — ABNORMAL HIGH
Potassium: 4

## 2011-07-22 LAB — B-NATRIURETIC PEPTIDE (CONVERTED LAB): Pro B Natriuretic peptide (BNP): <30

## 2011-07-22 LAB — I-STAT 8, (EC8 V) (CONVERTED LAB)
BUN: 17
Chloride: 101
Glucose, Bld: 165 — ABNORMAL HIGH
Hemoglobin: 16.7
Potassium: 3.7
Sodium: 139
pH, Ven: 7.309 — ABNORMAL HIGH

## 2011-07-22 LAB — CBC
HCT: 44.5
MCHC: 32.5
Platelets: 157
RDW: 13.7

## 2011-07-22 LAB — DIFFERENTIAL
Basophils Absolute: 0
Basophils Relative: 1
Eosinophils Absolute: 0.5
Eosinophils Relative: 11 — ABNORMAL HIGH
Lymphocytes Relative: 35
Monocytes Absolute: 0.6

## 2011-07-22 LAB — POCT I-STAT CREATININE: Creatinine, Ser: 1.1

## 2011-07-22 LAB — POCT CARDIAC MARKERS: CKMB, poc: 2.2

## 2011-12-23 ENCOUNTER — Ambulatory Visit
Admission: RE | Admit: 2011-12-23 | Discharge: 2011-12-23 | Disposition: A | Discharge status: Home / Self Care | Payer: Medicare Other | Source: Ambulatory Visit | Attending: Family Medicine | Admitting: Family Medicine

## 2011-12-23 ENCOUNTER — Other Ambulatory Visit: Payer: Self-pay | Admitting: Family Medicine

## 2011-12-23 DIAGNOSIS — J449 Chronic obstructive pulmonary disease, unspecified: Secondary | ICD-10-CM

## 2013-03-25 ENCOUNTER — Emergency Department (INDEPENDENT_AMBULATORY_CARE_PROVIDER_SITE_OTHER)
Admission: EM | Admit: 2013-03-25 | Discharge: 2013-03-25 | Disposition: A | Discharge status: Home / Self Care | Payer: Medicare Other | Source: Home / Self Care

## 2013-03-25 ENCOUNTER — Encounter (HOSPITAL_COMMUNITY): Payer: Self-pay

## 2013-03-25 DIAGNOSIS — H6120 Impacted cerumen, unspecified ear: Secondary | ICD-10-CM

## 2013-03-25 DIAGNOSIS — H6123 Impacted cerumen, bilateral: Secondary | ICD-10-CM

## 2013-03-25 HISTORY — DX: Unspecified glaucoma: H40.9

## 2013-03-25 HISTORY — DX: Essential (primary) hypertension: I10

## 2013-03-25 NOTE — ED Provider Notes (Signed)
History     CSN: [id number]  Arrival date & time 03/25/13  1002   First MD Initiated Contact with Patient 03/25/13 1020      Chief Complaint  Patient presents with  . Cerumen Impaction    (Consider location/radiation/quality/duration/timing/severity/associated sxs/prior treatment) HPI Comments: Pt presents complaining of stopped up ears.  No pain but does have decreased hearing.    The history is provided by the patient.    Past Medical History  Diagnosis Date  . Hypertension   . Glaucoma     History reviewed. No pertinent past surgical history.  History reviewed. No pertinent family history.  History  Substance Use Topics  . Smoking status: Not on file  . Smokeless tobacco: Not on file  . Alcohol Use: Not on file      Review of Systems  All other systems reviewed and are negative.    Allergies  Review of patient's allergies indicates no known allergies.  Home Medications   Current Outpatient Rx  Name  Route  Sig  Dispense  Refill  . albuterol (PROVENTIL HFA;VENTOLIN HFA) 108 (90 BASE) MCG/ACT inhaler   Inhalation   Inhale 2 puffs into the lungs every 6 (six) hours as needed for wheezing.         Marland Kitchen aspirin 81 MG tablet   Oral   Take 81 mg by mouth daily.         . brimonidine (ALPHAGAN) 0.2 % ophthalmic solution      1 drop 3 (three) times daily.         . brinzolamide (AZOPT) 1 % ophthalmic suspension      1 drop 3 (three) times daily.         . budesonide-formoterol (SYMBICORT) 80-4.5 MCG/ACT inhaler   Inhalation   Inhale 2 puffs into the lungs 2 (two) times daily.         . clopidogrel (PLAVIX) 75 MG tablet   Oral   Take 75 mg by mouth daily.         . travoprost, benzalkonium, (TRAVATAN) 0.004 % ophthalmic solution      1 drop at bedtime.           BP 142/74  Pulse 77  Temp(Src) 97.7 F (36.5 C) (Oral)  Resp 22  SpO2 96%  Physical Exam  Nursing note and vitals reviewed. Constitutional: He appears  well-developed.  HENT:  Head: Normocephalic and atraumatic.  Nose: Nose normal.  Mouth/Throat: Oropharynx is clear and moist.  Bilateral cerumen impaction.  Partially relieved with curetting bilaterally then fully re leaved with flushing.  Pt tolerated, no complications.  Neck: Normal range of motion. Neck supple.    ED Course  Procedures (including critical care time)  Labs Reviewed - No data to display No results found.   1. Impacted cerumen, bilateral [380.4]       MDM  Discussed dx and ear care.  Avoid qtips.  F/u prn        Maryelizabeth Rowan, MD 03/25/13 1040

## 2013-03-25 NOTE — ED Notes (Signed)
C/o ear stopped up for past couple of days; NAD

## 2013-03-25 NOTE — Patient Instructions (Signed)
Place cerumen impaction patient instructions here.

## 2013-06-07 ENCOUNTER — Encounter (HOSPITAL_COMMUNITY): Payer: Self-pay | Admitting: Emergency Medicine

## 2013-06-07 ENCOUNTER — Emergency Department (INDEPENDENT_AMBULATORY_CARE_PROVIDER_SITE_OTHER)
Admission: EM | Admit: 2013-06-07 | Discharge: 2013-06-07 | Disposition: A | Discharge status: Home / Self Care | Payer: Medicare Other | Source: Home / Self Care

## 2013-06-07 DIAGNOSIS — N400 Enlarged prostate without lower urinary tract symptoms: Secondary | ICD-10-CM

## 2013-06-07 DIAGNOSIS — N390 Urinary tract infection, site not specified: Secondary | ICD-10-CM

## 2013-06-07 DIAGNOSIS — R209 Unspecified disturbances of skin sensation: Secondary | ICD-10-CM

## 2013-06-07 DIAGNOSIS — R200 Anesthesia of skin: Secondary | ICD-10-CM

## 2013-06-07 LAB — POCT URINALYSIS DIP (DEVICE)
Glucose, UA: NEGATIVE mg/dL
Nitrite: POSITIVE — AB
Protein, ur: NEGATIVE mg/dL
Specific Gravity, Urine: 1.015 (ref 1.005–1.030)
Urobilinogen, UA: 0.2 mg/dL (ref 0.0–1.0)

## 2013-06-07 MED ORDER — TAMSULOSIN HCL 0.4 MG PO CAPS: 0.4000 mg | ORAL_CAPSULE | Freq: Every day | ORAL | Status: DC

## 2013-06-07 MED ORDER — CEPHALEXIN 500 MG PO CAPS: 500.0000 mg | ORAL_CAPSULE | Freq: Four times a day (QID) | ORAL | Status: DC

## 2013-06-07 NOTE — ED Notes (Signed)
C/o bilateral numbness in legs from the knee down. Symptoms started 3 days ago. Pt states hx of poor circulation. Denies pain.

## 2013-06-07 NOTE — ED Provider Notes (Signed)
CSN: [id number]     Arrival date & time 06/07/13  4098 History   None    Chief Complaint  Patient presents with  . Numbness    bilateral numbness. hx poor circulation.    (Consider location/radiation/quality/duration/timing/severity/associated sxs/prior Treatment) HPI Comments: 79 year old male is accompanied by significant other with complaints of numbness in his lower legs from the knees down for 2-3 days. He states it is often all. He denies having problems with ambulation. He denies pain. Denies swelling, discoloration or other abnormalities. He states he has a history of poor circulation and since he is on Plavix I can only present that he may have had a DVT or PVD in the past.  Second complaint is that of urinary frequency occurring since he started to get old. He states he has to go a bit more during the day and asked to get a 2-4 times during the night to urinate. He denies dysuria. Denies fever, chills, abdominal pain nausea or vomiting. He had mentioned this to his PCP a few months ago and was offered some pills to take but the patient states he was not sure if you want to do that.   Past Medical History  Diagnosis Date  . Hypertension   . Glaucoma    History reviewed. No pertinent past surgical history. History reviewed. No pertinent family history. History  Substance Use Topics  . Smoking status: Never Smoker   . Smokeless tobacco: Not on file  . Alcohol Use: No    Review of Systems  Constitutional: Negative for fever, activity change, appetite change and fatigue.  HENT: Negative.   Respiratory: Negative.   Cardiovascular: Negative.   Gastrointestinal: Negative for vomiting, abdominal pain, diarrhea, constipation and abdominal distention.  Genitourinary: Positive for frequency. Negative for dysuria, discharge, penile swelling, scrotal swelling, difficulty urinating, penile pain and testicular pain.  Musculoskeletal: Negative for joint swelling, arthralgias and gait  problem.  Skin: Negative for rash and wound.  Neurological: Positive for numbness.    Allergies  Review of patient's allergies indicates no known allergies.  Home Medications   Current Outpatient Rx  Name  Route  Sig  Dispense  Refill  . albuterol (PROVENTIL HFA;VENTOLIN HFA) 108 (90 BASE) MCG/ACT inhaler   Inhalation   Inhale 2 puffs into the lungs every 6 (six) hours as needed for wheezing.         Marland Kitchen aspirin 81 MG tablet   Oral   Take 81 mg by mouth daily.         . brimonidine (ALPHAGAN) 0.2 % ophthalmic solution      1 drop 3 (three) times daily.         . brinzolamide (AZOPT) 1 % ophthalmic suspension      1 drop 3 (three) times daily.         . budesonide-formoterol (SYMBICORT) 80-4.5 MCG/ACT inhaler   Inhalation   Inhale 2 puffs into the lungs 2 (two) times daily.         . clopidogrel (PLAVIX) 75 MG tablet   Oral   Take 75 mg by mouth daily.         . travoprost, benzalkonium, (TRAVATAN) 0.004 % ophthalmic solution      1 drop at bedtime.         . tamsulosin (FLOMAX) 0.4 MG CAPS capsule   Oral   Take 1 capsule (0.4 mg total) by mouth at bedtime.   30 capsule   0    BP 130/79  Pulse 82  Temp(Src) 98.6 F (37 C) (Oral)  Resp 14  SpO2 96% Physical Exam  Nursing note and vitals reviewed. Constitutional: He is oriented to person, place, and time. He appears well-developed and well-nourished. No distress.  This 79 year old male well-preserved alert and oriented and mostly cell sufficient. He is ambulatory in no acute distress.  Eyes: Conjunctivae and EOM are normal.  Neck: Normal range of motion. Neck supple.  Cardiovascular: Normal rate, regular rhythm and normal heart sounds.   Pulmonary/Chest: Effort normal. No respiratory distress. He has no wheezes.  Abdominal: Soft. He exhibits no distension. There is no tenderness.  Musculoskeletal: Normal range of motion. He exhibits no edema and no tenderness.  Lower extremities without evidence  of edema. No cyanosis or erythema. No calf or other tenderness. Distal pulses, tibialis and pedal pulses are bounding and 2+. Is able to move his toes, feet and ankles with full range of motion.  Strength bilaterally is 5 over 5. Full extension and flexion of the lower extremities. He is able to feel light touch with tissue paper on all surfaces of his lower extremities. His exam is unremarkable.  Neurological: He is alert and oriented to person, place, and time. He exhibits normal muscle tone.  Skin: Skin is warm and dry.  Psychiatric: He has a normal mood and affect.    ED Course  Procedures (including critical care time) Labs Review Labs Reviewed  POCT URINALYSIS DIP (DEVICE) - Abnormal; Notable for the following:    Hgb urine dipstick SMALL (*)    Nitrite POSITIVE (*)    Leukocytes, UA TRACE (*)    All other components within normal limits   Imaging Review No results found.  MDM  Examination lower extremities is unremarkable. He may follow up with his PCP if this is persistent problem or if getting worse. Resulted only he has prostatic hypertrophy we will treat with Flomax 0.4 mg q. D. He is to followup with his PCP within the next one to 2 weeks. Recheck promptly if any new symptoms problems worsening. Keflex for UTI Flomax .4 mg q d [place] of water       Hayden Rasmussen, NP 06/07/13 1028

## 2013-06-09 NOTE — ED Provider Notes (Signed)
Medical screening examination/treatment/procedure(s) were performed by resident physician or non-physician practitioner and as supervising physician I was immediately available for consultation/collaboration.   Dallan Schonberg DOUGLAS MD.   Ecko Beasley D Cambell Rickenbach, MD 06/09/13 2047 

## 2014-03-18 ENCOUNTER — Emergency Department (HOSPITAL_COMMUNITY)
Admission: EM | Admit: 2014-03-18 | Discharge: 2014-03-18 | Disposition: A | Discharge status: Home / Self Care | Payer: Medicare Other | Attending: Emergency Medicine | Admitting: Emergency Medicine

## 2014-03-18 ENCOUNTER — Encounter (HOSPITAL_COMMUNITY): Payer: Self-pay | Admitting: Emergency Medicine

## 2014-03-18 ENCOUNTER — Emergency Department (HOSPITAL_COMMUNITY): Payer: Medicare Other

## 2014-03-18 DIAGNOSIS — J438 Other emphysema: Secondary | ICD-10-CM | POA: Insufficient documentation

## 2014-03-18 DIAGNOSIS — R059 Cough, unspecified: Secondary | ICD-10-CM

## 2014-03-18 DIAGNOSIS — Z79899 Other long term (current) drug therapy: Secondary | ICD-10-CM | POA: Insufficient documentation

## 2014-03-18 DIAGNOSIS — H409 Unspecified glaucoma: Secondary | ICD-10-CM | POA: Insufficient documentation

## 2014-03-18 DIAGNOSIS — J439 Emphysema, unspecified: Secondary | ICD-10-CM

## 2014-03-18 DIAGNOSIS — I10 Essential (primary) hypertension: Secondary | ICD-10-CM | POA: Insufficient documentation

## 2014-03-18 DIAGNOSIS — R05 Cough: Secondary | ICD-10-CM

## 2014-03-18 DIAGNOSIS — Z7982 Long term (current) use of aspirin: Secondary | ICD-10-CM | POA: Insufficient documentation

## 2014-03-18 LAB — CBC WITH DIFFERENTIAL/PLATELET
BASOS PCT: 0 % (ref 0–1)
Basophils Absolute: 0 10*3/uL (ref 0.0–0.1)
Eosinophils Absolute: 0.1 10*3/uL (ref 0.0–0.7)
Eosinophils Relative: 2 % (ref 0–5)
HEMATOCRIT: 44.6 % (ref 39.0–52.0)
HEMOGLOBIN: 14.4 g/dL (ref 13.0–17.0)
Lymphocytes Relative: 29 % (ref 12–46)
Lymphs Abs: 1.1 10*3/uL (ref 0.7–4.0)
MCH: 28.6 pg (ref 26.0–34.0)
MCHC: 32.3 g/dL (ref 30.0–36.0)
MCV: 88.7 fL (ref 78.0–100.0)
MONO ABS: 0.4 10*3/uL (ref 0.1–1.0)
MONOS PCT: 11 % (ref 3–12)
NEUTROS ABS: 2.3 10*3/uL (ref 1.7–7.7)
Neutrophils Relative %: 58 % (ref 43–77)
Platelets: 143 10*3/uL — ABNORMAL LOW (ref 150–400)
RBC: 5.03 MIL/uL (ref 4.22–5.81)
RDW: 14.4 % (ref 11.5–15.5)
WBC: 3.9 10*3/uL — ABNORMAL LOW (ref 4.0–10.5)

## 2014-03-18 LAB — BASIC METABOLIC PANEL
BUN: 11 mg/dL (ref 6–23)
CHLORIDE: 106 meq/L (ref 96–112)
CO2: 25 meq/L (ref 19–32)
CREATININE: 0.85 mg/dL (ref 0.50–1.35)
Calcium: 9.1 mg/dL (ref 8.4–10.5)
GFR calc non Af Amer: 73 mL/min — ABNORMAL LOW (ref >90)
GFR, EST AFRICAN AMERICAN: 85 mL/min — AB (ref >90)
GLUCOSE: 106 mg/dL — AB (ref 70–99)
Potassium: 3.8 mEq/L (ref 3.7–5.3)
Sodium: 144 mEq/L (ref 137–147)

## 2014-03-18 LAB — TROPONIN I: Troponin I: <0.3 ng/mL (ref <0.30)

## 2014-03-18 LAB — PRO B NATRIURETIC PEPTIDE: Pro B Natriuretic peptide (BNP): 171.3 pg/mL (ref 0–450)

## 2014-03-18 MED ORDER — GUAIFENESIN 100 MG/5ML PO SYRP: 200.0000 mg | ORAL_SOLUTION | ORAL | Status: DC | PRN

## 2014-03-18 NOTE — ED Provider Notes (Signed)
CSN: [id number]     Arrival date & time 03/18/14  1336 History   First MD Initiated Contact with Patient 03/18/14 1359     Chief Complaint  Patient presents with  . Shortness of Breath     (Consider location/radiation/quality/duration/timing/severity/associated sxs/prior Treatment) HPI Comments: Pt comes in with cc of dib. Pt reports that starting last night, he has been having a dry cough, and that he feels that something is stuck in his throat. He has had similar feeling in the past, but he is able to cough out a phlegm - this time nothing is coming out. He is definitely sure that there is no food stuck - and he had oatmeal for breakfast today. PT has no chest pain, n/v/f/c, wheezing No hx of allergies.   Patient is a 80 y.o. male presenting with shortness of breath. The history is provided by the patient.  Shortness of Breath Associated symptoms: cough   Associated symptoms: no abdominal pain, no chest pain and no wheezing     Past Medical History  Diagnosis Date  . Hypertension   . Glaucoma    History reviewed. No pertinent past surgical history. No family history on file. History  Substance Use Topics  . Smoking status: Never Smoker   . Smokeless tobacco: Not on file  . Alcohol Use: No    Review of Systems  Constitutional: Negative for activity change and appetite change.  Respiratory: Positive for cough and shortness of breath. Negative for wheezing and stridor.   Cardiovascular: Negative for chest pain.  Gastrointestinal: Negative for abdominal pain.  Genitourinary: Negative for dysuria.      Allergies  Review of patient's allergies indicates no known allergies.  Home Medications   Prior to Admission medications   Medication Sig Start Date End Date Taking? Authorizing Provider  albuterol (PROVENTIL HFA;VENTOLIN HFA) 108 (90 BASE) MCG/ACT inhaler Inhale 2 puffs into the lungs every 6 (six) hours as needed for wheezing.   Yes Historical Provider, MD  amLODipine  (NORVASC) 10 MG tablet Take 10 mg by mouth daily.   Yes Historical Provider, MD  aspirin 81 MG tablet Take 81 mg by mouth daily.   Yes Historical Provider, MD  brimonidine (ALPHAGAN) 0.2 % ophthalmic solution Place 1 drop into the right eye 3 (three) times daily.    Yes Historical Provider, MD  brinzolamide (AZOPT) 1 % ophthalmic suspension Place 1 drop into the right eye 3 (three) times daily.    Yes Historical Provider, MD  budesonide-formoterol (SYMBICORT) 80-4.5 MCG/ACT inhaler Inhale 2 puffs into the lungs 2 (two) times daily.   Yes Historical Provider, MD  clopidogrel (PLAVIX) 75 MG tablet Take 75 mg by mouth daily.   Yes Historical Provider, MD  rosuvastatin (CRESTOR) 10 MG tablet Take 10 mg by mouth daily.   Yes Historical Provider, MD  tamsulosin (FLOMAX) 0.4 MG CAPS capsule Take 1 capsule (0.4 mg total) by mouth at bedtime. 06/07/13  Yes Hayden Rasmussenavid Mabe, NP  travoprost, benzalkonium, (TRAVATAN) 0.004 % ophthalmic solution Place 1 drop into both eyes at bedtime.    Yes Historical Provider, MD  guaifenesin (ROBITUSSIN) 100 MG/5ML syrup Take 10 mLs (200 mg total) by mouth every 4 (four) hours as needed for cough. 03/18/14   Yarianna Varble, MD   BP 142/87  Pulse 82  Temp(Src) 97.9 F (36.6 C) (Oral)  Resp 21  SpO2 97% Physical Exam  Nursing note and vitals reviewed. Constitutional: He is oriented to person, place, and time. He appears well-developed.  HENT:  Head: Normocephalic and atraumatic.  Eyes: Conjunctivae and EOM are normal. Pupils are equal, round, and reactive to light.  Neck: Normal range of motion. Neck supple.  Cardiovascular: Normal rate and regular rhythm.   Pulmonary/Chest: Effort normal and breath sounds normal. No respiratory distress. He has no wheezes. He has no rales.  + rhonchi - bilateral  Abdominal: Soft. Bowel sounds are normal. He exhibits no distension. There is no tenderness. There is no rebound and no guarding.  Neurological: He is alert and oriented to person,  place, and time.  Skin: Skin is warm.    ED Course  Procedures (including critical care time) Labs Review Labs Reviewed  CBC WITH DIFFERENTIAL - Abnormal; Notable for the following:    WBC 3.9 (*)    Platelets 143 (*)    All other components within normal limits  BASIC METABOLIC PANEL - Abnormal; Notable for the following:    Glucose, Bld 106 (*)    GFR calc non Af Amer 73 (*)    GFR calc Af Amer 85 (*)    All other components within normal limits  TROPONIN I  PRO B NATRIURETIC PEPTIDE    Imaging Review Dg Chest 2 View  03/18/2014   CLINICAL DATA:  Tightness in the throat.  Shortness of breath.  EXAM: CHEST  2 VIEW  COMPARISON:  Chest x-ray 12/23/2011.  FINDINGS: Emphysematous changes are again noted throughout the lungs bilaterally. No confluent consolidative airspace disease. No pleural effusions. No evidence of pulmonary edema. Heart size is normal. Atherosclerosis in the thoracic aorta. Upper mediastinal contours are unremarkable. Prominent bilateral nipple shadows, similar to prior studies. No other definite suspicious appearing pulmonary nodules or masses are noted.  IMPRESSION: 1. Emphysema redemonstrated, without radiographic evidence of acute cardiopulmonary disease. 2. Atherosclerosis.   Electronically Signed   By: Trudie Reed M.D.   On: 03/18/2014 16:53     EKG Interpretation   Date/Time:  Saturday March 18 2014 13:48:12 EDT Ventricular Rate:  82 PR Interval:  197 QRS Duration: 101 QT Interval:  378 QTC Calculation: 441 R Axis:   20 Text Interpretation:  Sinus rhythm Paired ventricular premature complexes  Nonspecific T abnormalities, lateral leads Confirmed by Rhunette Croft, MD,  Janey Genta [phone]) on 03/18/2014 3:32:20 PM      MDM   Final diagnoses:  Cough  Emphysema lung    Pt with cc of cough. Denies any shortness of breath or dib - but states that the cough is not producing any phlegm and he feels like mucus is stuck. No fevers, labs WNL, Xrays normal. Pt  would appreciate mucolytic - and so that has been prescribed. Stable for discharge.  Return precautions discussed.    Derwood Kaplan, MD 03/18/14 1730

## 2014-03-18 NOTE — Discharge Instructions (Signed)
We saw you in the ER for the cough. All the results in the ER are normal, labs and imaging. We are not sure what is causing your symptoms. Take the medications as prescribed. The workup in the ER is not complete, and is limited to screening for life threatening and emergent conditions only, so please see a primary care doctor for further evaluation.   Cool Mist Vaporizers Vaporizers may help relieve the symptoms of a cough and cold. They add moisture to the air, which helps mucus to become thinner and less sticky. This makes it easier to breathe and cough up secretions. Cool mist vaporizers do not cause serious burns like hot mist vaporizers ("steamers, humidifiers"). Vaporizers have not been proved to show they help with colds. You should not use a vaporizer if you are allergic to mold.  HOME CARE INSTRUCTIONS  Follow the package instructions for the vaporizer.  Do not use anything other than distilled water in the vaporizer.  Do not run the vaporizer all of the time. This can cause mold or bacteria to grow in the vaporizer.  Clean the vaporizer after each time it is used.  Clean and dry the vaporizer well before storing it.  Stop using the vaporizer if worsening respiratory symptoms develop. Document Released: 06/26/2004 Document Revised: 06/01/2013 Document Reviewed: 02/16/2013 [facility] Patient Information 2014 [place], [place].  Cough, Adult  A cough is a reflex that helps clear your throat and airways. It can help heal the body or may be a reaction to an irritated airway. A cough may only last 2 or 3 weeks (acute) or may last more than 8 weeks (chronic).  CAUSES Acute cough:  Viral or bacterial infections. Chronic cough:  Infections.  Allergies.  Asthma.  Post-nasal drip.  Smoking.  Heartburn or acid reflux.  Some medicines.  Chronic lung problems (COPD).  Cancer. SYMPTOMS   Cough.  Fever.  Chest pain.  Increased breathing rate.  High-pitched whistling  sound when breathing (wheezing).  Colored mucus that you cough up (sputum). TREATMENT   A bacterial cough may be treated with antibiotic medicine.  A viral cough must run its course and will not respond to antibiotics.  Your caregiver may recommend other treatments if you have a chronic cough. HOME CARE INSTRUCTIONS   Only take over-the-counter or prescription medicines for pain, discomfort, or fever as directed by your caregiver. Use cough suppressants only as directed by your caregiver.  Use a cold steam vaporizer or humidifier in your bedroom or home to help loosen secretions.  Sleep in a semi-upright position if your cough is worse at night.  Rest as needed.  Stop smoking if you smoke. SEEK IMMEDIATE MEDICAL CARE IF:   You have pus in your sputum.  Your cough starts to worsen.  You cannot control your cough with suppressants and are losing sleep.  You begin coughing up blood.  You have difficulty breathing.  You develop pain which is getting worse or is uncontrolled with medicine.  You have a fever. MAKE SURE YOU:   Understand these instructions.  Will watch your condition.  Will get help right away if you are not doing well or get worse. Document Released: 03/28/2011 Document Revised: 12/22/2011 Document Reviewed: 03/28/2011 [facility] Patient Information 2014 [place], [place].

## 2014-03-18 NOTE — ED Notes (Signed)
O2 sat probe changed due to poor pleth and low reading.  O2 sats 97-98% on RA.

## 2014-03-18 NOTE — ED Notes (Signed)
Ambulated patient in hall. Patient was able to walk unassisted. Pt O2 level at 97% RA , HR 87 prior to ambulation. During ambulation O2 level dropped to 92-91% , HR 93. At completion of walk(distance approx. 100 ft), patient O2 level at 90% and HR 94.

## 2014-03-18 NOTE — ED Notes (Signed)
Pt in via [facility].  Pt reports SOB onset yesterday.  Pt with non-productive cough.  Pt states he feels like something is "stuck" and won't come out.  EMS reports course rhonchi R lung.  Clear L lung.  EMS reports O2 sats 98% on RA at rest as well as when ambulating to ambulance.

## 2014-08-06 ENCOUNTER — Emergency Department (HOSPITAL_COMMUNITY)
Admission: EM | Admit: 2014-08-06 | Discharge: 2014-08-06 | Disposition: A | Discharge status: Home / Self Care | Payer: Medicare Other | Attending: Emergency Medicine | Admitting: Emergency Medicine

## 2014-08-06 ENCOUNTER — Emergency Department (HOSPITAL_COMMUNITY): Payer: Medicare Other

## 2014-08-06 ENCOUNTER — Encounter (HOSPITAL_COMMUNITY): Payer: Self-pay | Admitting: Emergency Medicine

## 2014-08-06 DIAGNOSIS — I10 Essential (primary) hypertension: Secondary | ICD-10-CM | POA: Diagnosis not present

## 2014-08-06 DIAGNOSIS — Z7982 Long term (current) use of aspirin: Secondary | ICD-10-CM | POA: Diagnosis not present

## 2014-08-06 DIAGNOSIS — J439 Emphysema, unspecified: Secondary | ICD-10-CM | POA: Diagnosis not present

## 2014-08-06 DIAGNOSIS — H409 Unspecified glaucoma: Secondary | ICD-10-CM | POA: Insufficient documentation

## 2014-08-06 DIAGNOSIS — R490 Dysphonia: Secondary | ICD-10-CM | POA: Diagnosis not present

## 2014-08-06 DIAGNOSIS — R05 Cough: Secondary | ICD-10-CM | POA: Diagnosis present

## 2014-08-06 DIAGNOSIS — Z7902 Long term (current) use of antithrombotics/antiplatelets: Secondary | ICD-10-CM | POA: Diagnosis not present

## 2014-08-06 DIAGNOSIS — Z79899 Other long term (current) drug therapy: Secondary | ICD-10-CM | POA: Diagnosis not present

## 2014-08-06 DIAGNOSIS — R0602 Shortness of breath: Secondary | ICD-10-CM

## 2014-08-06 LAB — BASIC METABOLIC PANEL
Anion gap: 14 (ref 5–15)
BUN: 22 mg/dL (ref 6–23)
CALCIUM: 9.5 mg/dL (ref 8.4–10.5)
CHLORIDE: 103 meq/L (ref 96–112)
CO2: 25 meq/L (ref 19–32)
CREATININE: 0.88 mg/dL (ref 0.50–1.35)
GFR calc Af Amer: 84 mL/min — ABNORMAL LOW (ref >90)
GFR calc non Af Amer: 72 mL/min — ABNORMAL LOW (ref >90)
Glucose, Bld: 118 mg/dL — ABNORMAL HIGH (ref 70–99)
Potassium: 4 mEq/L (ref 3.7–5.3)
Sodium: 142 mEq/L (ref 137–147)

## 2014-08-06 LAB — CBC
HEMATOCRIT: 45.6 % (ref 39.0–52.0)
Hemoglobin: 15.3 g/dL (ref 13.0–17.0)
MCH: 28.9 pg (ref 26.0–34.0)
MCHC: 33.6 g/dL (ref 30.0–36.0)
MCV: 86 fL (ref 78.0–100.0)
Platelets: 155 10*3/uL (ref 150–400)
RBC: 5.3 MIL/uL (ref 4.22–5.81)
RDW: 14.1 % (ref 11.5–15.5)
WBC: 6.4 10*3/uL (ref 4.0–10.5)

## 2014-08-06 LAB — PRO B NATRIURETIC PEPTIDE: Pro B Natriuretic peptide (BNP): 197.7 pg/mL (ref 0–450)

## 2014-08-06 LAB — I-STAT TROPONIN, ED: Troponin i, poc: 0.02 ng/mL (ref 0.00–0.08)

## 2014-08-06 MED ORDER — PREDNISONE 20 MG PO TABS: 40.0000 mg | ORAL_TABLET | Freq: Once | ORAL | Status: DC

## 2014-08-06 MED ORDER — ALBUTEROL SULFATE HFA 108 (90 BASE) MCG/ACT IN AERS: 2.0000 | INHALATION_SPRAY | RESPIRATORY_TRACT | Status: DC | PRN

## 2014-08-06 MED ORDER — ALBUTEROL (5 MG/ML) CONTINUOUS INHALATION SOLN
10.0000 mg/h | INHALATION_SOLUTION | Freq: Once | RESPIRATORY_TRACT | Status: AC
  Administered 2014-08-06: 10 mg/h via RESPIRATORY_TRACT
  Filled 2014-08-06: qty 20

## 2014-08-06 MED ORDER — PREDNISONE 20 MG PO TABS
60.0000 mg | ORAL_TABLET | Freq: Once | ORAL | Status: AC
  Administered 2014-08-06: 60 mg via ORAL
  Filled 2014-08-06: qty 3

## 2014-08-06 MED ORDER — IPRATROPIUM BROMIDE 0.02 % IN SOLN
0.5000 mg | Freq: Once | RESPIRATORY_TRACT | Status: AC
  Administered 2014-08-06: 0.5 mg via RESPIRATORY_TRACT
  Filled 2014-08-06: qty 2.5

## 2014-08-06 MED ORDER — DOXYCYCLINE HYCLATE 100 MG PO CAPS: 100.0000 mg | ORAL_CAPSULE | Freq: Two times a day (BID) | ORAL | Status: DC

## 2014-08-06 NOTE — ED Notes (Signed)
Patient coming from Xray 

## 2014-08-06 NOTE — ED Notes (Signed)
C/o hoarseness x 3 days.  Reports 3 month history of sob and productive cough with white sputum.  Denies pain.  Pt sob with talking.

## 2014-08-06 NOTE — Discharge Instructions (Signed)
Chronic Obstructive Pulmonary Disease  Chronic obstructive pulmonary disease (COPD) is a common lung condition in which airflow from the lungs is limited. COPD is a general term that can be used to describe many different lung problems that limit airflow, including both chronic bronchitis and emphysema. If you have COPD, your lung function will probably never return to normal, but there are measures you can take to improve lung function and make yourself feel better.   CAUSES    Smoking (common).    Exposure to secondhand smoke.    Genetic problems.   Chronic inflammatory lung diseases or recurrent infections.  SYMPTOMS    Shortness of breath, especially with physical activity.    Deep, persistent (chronic) cough with a large amount of thick mucus.    Wheezing.    Rapid breaths (tachypnea).    Gray or bluish discoloration (cyanosis) of the skin, especially in fingers, toes, or lips.    Fatigue.    Weight loss.    Frequent infections or episodes when breathing symptoms become much worse (exacerbations).    Chest tightness.  DIAGNOSIS   Your health care provider will take a medical history and perform a physical examination to make the initial diagnosis. Additional tests for COPD may include:    Lung (pulmonary) function tests.   Chest X-ray.   CT scan.   Blood tests.  TREATMENT   Treatment available to help you feel better when you have COPD includes:    Inhaler and nebulizer medicines. These help manage the symptoms of COPD and make your breathing more comfortable.   Supplemental oxygen. Supplemental oxygen is only helpful if you have a low oxygen level in your blood.    Exercise and physical activity. These are beneficial for nearly all people with COPD. Some people may also benefit from a pulmonary rehabilitation program.  HOME CARE INSTRUCTIONS    Take all medicines (inhaled or pills) as directed by your health care provider.   Avoid over-the-counter medicines or cough syrups  that dry up your airway (such as antihistamines) and slow down the elimination of secretions unless instructed otherwise by your health care provider.    If you are a smoker, the most important thing that you can do is stop smoking. Continuing to smoke will cause further lung damage and breathing trouble. Ask your health care provider for help with quitting smoking. He or she can direct you to community resources or hospitals that provide support.   Avoid exposure to irritants such as smoke, chemicals, and fumes that aggravate your breathing.   Use oxygen therapy and pulmonary rehabilitation if directed by your health care provider. If you require home oxygen therapy, ask your health care provider whether you should purchase a pulse oximeter to measure your oxygen level at home.    Avoid contact with individuals who have a contagious illness.   Avoid extreme temperature and humidity changes.   Eat healthy foods. Eating smaller, more frequent meals and resting before meals may help you maintain your strength.   Stay active, but balance activity with periods of rest. Exercise and physical activity will help you maintain your ability to do things you want to do.   Preventing infection and hospitalization is very important when you have COPD. Make sure to receive all the vaccines your health care provider recommends, especially the pneumococcal and influenza vaccines. Ask your health care provider whether you need a pneumonia vaccine.   Learn and use relaxation techniques to manage stress.   Learn   going to whistle and breathe out (exhale) through the pursed lips for 2 seconds.   Diaphragmatic breathing. Start by putting one hand on your abdomen just above  your waist. Inhale slowly through your nose. The hand on your abdomen should move out. Then purse your lips and exhale slowly. You should be able to feel the hand on your abdomen moving in as you exhale.   Learn and use controlled coughing to clear mucus from your lungs. Controlled coughing is a series of short, progressive coughs. The steps of controlled coughing are:  1. Lean your head slightly forward.  2. Breathe in deeply using diaphragmatic breathing.  3. Try to hold your breath for 3 seconds.  4. Keep your mouth slightly open while coughing twice.  5. Spit any mucus out into a tissue.  6. Rest and repeat the steps once or twice as needed. SEEK MEDICAL CARE IF:   You are coughing up more mucus than usual.   There is a change in the color or thickness of your mucus.   Your breathing is more labored than usual.   Your breathing is faster than usual.  SEEK IMMEDIATE MEDICAL CARE IF:   You have shortness of breath while you are resting.   You have shortness of breath that prevents you from:  Being able to talk.   Performing your usual physical activities.   You have chest pain lasting longer than 5 minutes.   Your skin color is more cyanotic than usual.  You measure low oxygen saturations for longer than 5 minutes with a pulse oximeter. MAKE SURE YOU:   Understand these instructions.  Will watch your condition.  Will get help right away if you are not doing well or get worse. Document Released: 07/09/2005 Document Revised: 02/13/2014 Document Reviewed: 05/26/2013 [facility] Patient Information 2015 [place], [place]. This information is not intended to replace advice given to you by your health care provider. Make sure you discuss any questions you have with your health care provider.   Emergency Department Resource Guide 1) Find a Doctor and Pay Out of Pocket Although you won't have to find out who is covered by your insurance plan, it is a good idea to ask  around and get recommendations. You will then need to call the office and see if the doctor you have chosen will accept you as a new patient and what types of options they offer for patients who are self-pay. Some doctors offer discounts or will set up payment plans for their patients who do not have insurance, but you will need to ask so you aren't surprised when you get to your appointment.  2) Contact Your Local Health Department Not all health departments have doctors that can see patients for sick visits, but many do, so it is worth a call to see if yours does. If you don't know where your local health department is, you can check in your phone book. The CDC also has a tool to help you locate your state's health department, and many state websites also have listings of all of their local health departments.  3) Find a Walk-in Clinic If your illness is not likely to be very severe or complicated, you may want to try a walk in clinic. These are popping up all over the country in pharmacies, drugstores, and shopping centers. They're usually staffed by nurse practitioners or physician assistants that have been trained to treat common illnesses and complaints. They're usually fairly quick and inexpensive. However,  if you have serious medical issues or chronic medical problems, these are probably not your best option.  No Primary Care Doctor: - Call Health Connect at  [phone] - they can help you locate a primary care doctor that  accepts your insurance, provides certain services, etc. - Physician Referral Service- [phone]  Chronic Pain Problems: Organization         Address  Phone   Notes  [facility]  [phone] Patients need to be referred by their primary care doctor.   Medication Assistance: Organization         Address  Phone   Notes  [facility] Medication [facility] [address] [place], [place] [address] [phone] --Must be a  resident of [facility] [place] -- Must have NO insurance coverage whatsoever (no Medicaid/ Medicare, etc.) -- The pt. MUST have a primary care doctor that directs their care regularly and follows them in the community   MedAssist  [phone]   [employer]  [phone]    Agencies that provide inexpensive medical care: Organization         Address  Phone   Notes  [facility]  [phone]   [facility]    [phone]   [facility] [address] [place], [place] [address] [phone]   [facility] [address], [place] [phone]   Planned Parenthood    [phone]   [facility]    [phone]   [facility]  [address], [place] Phone:  [phone], Fax:  [phone]) [phone] Hours of Operation:  9 am - 6 pm, M-F.  Also accepts Medicaid/Medicare and self-pay.  [facility]  [address], [place] Phone: [phone], Fax: [phone]. Hours of Operation:  8:30 am - 5:30 pm, M-F.  Also accepts Medicaid and self-pay.  [facility] [place] [address], [place] Phone: [phone]) [phone]   [facility] [address], [place], [place] [phone], Ext. [phone] Mondays & Thursdays: 7-9 AM.  First 15 patients are seen on a first come, first serve basis.    Medicaid-accepting [facility] Providers:  Organization         Address  Phone   Notes  [facility] [address], [place] [phone] Also accepts self-pay patients.  [facility] [address], [place]  [phone]) [phone]   [facility] [address], [place] [phone]   [facility] [address] [place] [address], [place] [phone]) [phone]   Renaye RakersVeita Bland [address], [place]   [phone]) [phone] Only accepts  [place] Access [place] patients after they have their name applied to their card.   Self-Pay (no insurance) in [facility] [place]:  Organization         Address  Phone   Notes  Sickle Cell Patients, [facility] [address], [place] [phone]   [facility] [address], [place] [phone]   [facility] Backus  [address] [place] [address], [place] [phone]) [phone]   [facility]/Dr. Osei-Bonsu  [address] [place] [address], [place] or [id number], [place] [phone] Phone number for both [facility]  and [place] locations is the same.  [facility] [address] [place] [phone]   [facility] [address], [place] or [address] [phone]   [facility] [address] [place] [phone], phone; [phone], fax Sees patients 1st and 3rd Saturday of every month.  Must not qualify for public or private insurance (i.e. Medicaid, Medicare, [place] Health Choice, Veterans' Benefits)  Household income should be no more than 200% of the poverty level The clinic cannot treat you if you are pregnant or think you are pregnant  Sexually transmitted diseases are not treated at the clinic.    Dental Care: Organization         Address  Phone  Notes  [facility] of [facility] [address] [place], [place] [phone] Accepts children up to age 53 who are enrolled in [place] or [place] Health Choice; pregnant women with a Medicaid card; and children who have applied for Medicaid or [place] Health Choice, but were declined, whose parents can pay a reduced fee at time of service.  [facility] Department of [facility]  [address], [place] [phone] Accepts children up to age 26 who are enrolled in [place] or [place] Health Choice; pregnant women  with a Medicaid card; and children who have applied for Medicaid or  Health Choice, but were declined, whose parents can pay a reduced fee at time of service.  [facility]  [address] [place], [place] [phone] Patients are seen by appointment only. Walk-ins are not accepted. [facility] will see patients 37 years of age and older. Monday - Tuesday (8am-5pm) Most Wednesdays (8:30-5pm) $30 per visit, cash only  [facility] Adult Dental Access PROGRAM  [address], [facility] [phone] Patients are seen by appointment only. Walk-ins are not accepted. [facility] will see patients 67 years of age and older. One Wednesday Evening (Monthly: Volunteer Based).  $30 per visit, cash only  [employer]  [phone] for adults; Children under age 78, call Graduate Pediatric Dentistry at [phone]. Children aged 35-14, please call [phone] to request a pediatric application.  Dental services are provided in all areas of dental care including fillings, crowns and bridges, complete and partial dentures, implants, gum treatment, root canals, and extractions. Preventive care is also provided. Treatment is provided to both adults and children. Patients are selected via a lottery and there is often a waiting list.   [facility] [address], [place]  [phone] [email or web address]   [facility] [address] [place], [place] [phone], Ext. [phone] Second and Fourth Thursday of each month, opens at 6:30 AM; Clinic ends at 9 AM.  Patients are seen on a first-come first-served basis, and a limited number are seen during each clinic.   [facility]  [address] [place], [place] [phone]   Eligibility Requirements You must have lived in [place], [place], or [place] counties for at least the last three months.   You cannot be eligible for state or federal sponsored [employer], including [employer], [place], or [employer].   You generally cannot be eligible for healthcare insurance through your employer.    How to apply: Eligibility screenings are held every Tuesday and Wednesday afternoon from 1:00 pm until 4:00 pm. You do  not need an appointment for the interview!  [facility] [address], [place], [place] [phone]   [facility]  [phone]   [facility]  [phone]   [facility]  [phone]    Behavioral Health Resources in the Community: Intensive Outpatient Programs Organization         Address  Phone  Notes  [facility] [address], [place], [place] [phone]   [facility] [address], [place], [place] [phone]   [facility] [address], [place], [place]  [phone]   [facility] [address],  [place], [place] [phone] or [phone]   Substance Abuse Resources Organization         Address  Phone  Notes  [facility] Drug Services  [phone]   [facility]  [phone]   [facility]  [phone]   [facility]  [phone]   Residential & Outpatient Substance Abuse Program  [phone]   Psychological Services Organization         Address  Phone  Notes  [facility]  [phone]   [facility]  [phone]- [phone]   [facility] [address], [place] [phone] or [phone]    Mobile Crisis Teams Organization         Address  Phone  Notes  [facility], Mobile Crisis Care Unit  [phone]   [facility]  [address] [place], [place] [phone]   Doristine LocksSharon DeEsch [address] [place] [phone]    Self-Help/Support Groups Organization         Address  Phone             Notes  [facility]ental  Health Assoc. of [place] - variety of support groups  [phone]- [phone] Call for more information  Narcotics Anonymous (NA), Caring Services [address], [place]  2 meetings at this location   Statisticianesidential Treatment Programs Organization         Address  Phone  Notes  [facility] [address],    [place]  [phone]   [facility]  [address], [place] [address]118, [place], [place] [phone]   [facility] [address], [place] [phone] Admissions: 8am-3pm M-F  [facility] [address]    [place], [place] [phone]   [facility] [address], [place], [place] [phone]   [facility] [address]  [place], [place] [phone]   [facility] - Intensive Outpatient [address], [place], [place] [phone]   [facility] ([facility] [address]  [place], [place] [phone] or [phone]   [facility] (RT[facility]) [address] [place], [place] [phone] Accepts Medicaid  [facility] [address]  [place] [phone] Substance Abuse/Addiction Treatment   [facility] Behavioral Health Resources Organization         Address  Phone  Notes  [facility]  [phone]   Angie FavaJulie Brannon, PhD [address], [address] [place], [place]   [phone] or [phone]   [facility]   [address] [place], [place] [phone]   [facility] [address], [place], [place] [phone]) [phone] Insurance/Medicaid/sponsorship through [employer] [facility]  Faith and Families [address]  [place], [place] [phone] [place], [place] [phone]    Dr. Adele Schilder  [phone]   [facility] Dept. 1) [address]  [place] 2) [place] 3)  [address], [place] [phone]  [phone]   [place] [phone] or [phone] (After Hours)

## 2014-08-06 NOTE — ED Notes (Signed)
MD Walden at the bedside. 

## 2014-08-06 NOTE — ED Notes (Signed)
MD Gwendolyn GrantWalden made aware of patient's abnormal EKG. Gwendolyn GrantWalden reported patient was okay to be discharged.

## 2014-08-06 NOTE — ED Provider Notes (Signed)
CSN: [id number]     Arrival date & time 08/06/14  1830 History   First MD Initiated Contact with Patient 08/06/14 1949     Chief Complaint  Patient presents with  . Hoarse  . Shortness of Breath  . Cough     (Consider location/radiation/quality/duration/timing/severity/associated sxs/prior Treatment) Patient is a 80 y.o. male presenting with shortness of breath and cough. The history is provided by the patient.  Shortness of Breath Severity:  Mild Onset quality:  Gradual Duration:  3 days Timing:  Constant Progression:  Unchanged Chronicity:  New Context: not URI   Relieved by:  Nothing Worsened by:  Nothing tried Associated symptoms: cough (3 months)   Associated symptoms: no abdominal pain, no chest pain, no fever and no vomiting   Cough Associated symptoms: shortness of breath (3 days)   Associated symptoms: no chest pain, no chills and no fever     Past Medical History  Diagnosis Date  . Hypertension   . Glaucoma    History reviewed. No pertinent past surgical history. No family history on file. History  Substance Use Topics  . Smoking status: Never Smoker   . Smokeless tobacco: Not on file  . Alcohol Use: No    Review of Systems  Constitutional: Negative for fever and chills.  Respiratory: Positive for cough (3 months) and shortness of breath (3 days).   Cardiovascular: Negative for chest pain and leg swelling.  Gastrointestinal: Negative for vomiting and abdominal pain.  All other systems reviewed and are negative.     Allergies  Review of patient's allergies indicates no known allergies.  Home Medications   Prior to Admission medications   Medication Sig Start Date End Date Taking? Authorizing Provider  albuterol (PROVENTIL HFA;VENTOLIN HFA) 108 (90 BASE) MCG/ACT inhaler Inhale 2 puffs into the lungs every 6 (six) hours as needed for wheezing.   Yes Historical Provider, MD  amLODipine (NORVASC) 10 MG tablet Take 10 mg by mouth daily.   Yes  Historical Provider, MD  aspirin 81 MG tablet Take 81 mg by mouth daily.   Yes Historical Provider, MD  brimonidine (ALPHAGAN) 0.2 % ophthalmic solution Place 1 drop into the right eye 3 (three) times daily.    Yes Historical Provider, MD  brinzolamide (AZOPT) 1 % ophthalmic suspension Place 1 drop into the right eye 3 (three) times daily.    Yes Historical Provider, MD  budesonide-formoterol (SYMBICORT) 80-4.5 MCG/ACT inhaler Inhale 2 puffs into the lungs 2 (two) times daily.   Yes Historical Provider, MD  clopidogrel (PLAVIX) 75 MG tablet Take 75 mg by mouth daily.   Yes Historical Provider, MD  guaifenesin (ROBITUSSIN) 100 MG/5ML syrup Take 10 mLs (200 mg total) by mouth every 4 (four) hours as needed for cough. 03/18/14  Yes Derwood KaplanAnkit Nanavati, MD  rosuvastatin (CRESTOR) 10 MG tablet Take 10 mg by mouth daily.   Yes Historical Provider, MD  tamsulosin (FLOMAX) 0.4 MG CAPS capsule Take 1 capsule (0.4 mg total) by mouth at bedtime. 06/07/13  Yes Hayden Rasmussenavid Mabe, NP  travoprost, benzalkonium, (TRAVATAN) 0.004 % ophthalmic solution Place 1 drop into both eyes at bedtime.    Yes Historical Provider, MD   BP 115/45  Pulse 77  Temp(Src) 98.2 F (36.8 C) (Oral)  Resp 22  SpO2 96% Physical Exam  Nursing note and vitals reviewed. Constitutional: He is oriented to person, place, and time. He appears well-developed and well-nourished. No distress.  HENT:  Head: Normocephalic and atraumatic.  Mouth/Throat: Oropharynx is clear and  moist. No oropharyngeal exudate.  Eyes: EOM are normal. Pupils are equal, round, and reactive to light.  Neck: Normal range of motion. Neck supple.  Cardiovascular: Normal rate and regular rhythm.  Exam reveals no friction rub.   No murmur heard. Pulmonary/Chest: Effort normal and breath sounds normal. No respiratory distress. He has no wheezes. He has no rales.  Abdominal: He exhibits no distension. There is no tenderness. There is no rebound.  Musculoskeletal: Normal range of  motion. He exhibits no edema.  Neurological: He is alert and oriented to person, place, and time. No cranial nerve deficit. He exhibits normal muscle tone. Coordination normal.  Skin: Skin is warm. No rash noted. He is not diaphoretic.    ED Course  Procedures (including critical care time) Labs Review Labs Reviewed  BASIC METABOLIC PANEL - Abnormal; Notable for the following:    Glucose, Bld 118 (*)    GFR calc non Af Amer 72 (*)    GFR calc Af Amer 84 (*)    All other components within normal limits  CBC  PRO B NATRIURETIC PEPTIDE  I-STAT TROPOININ, ED    Imaging Review Dg Chest 2 View  08/06/2014   CLINICAL DATA:  Shortness of breath, hoarseness  EXAM: CHEST  2 VIEW  COMPARISON:  03/18/2014  FINDINGS: Chronic-markings/emphysematous changes. No focal consolidation. No pleural effusion or pneumothorax.  The heart is normal in size.  Visualized osseous structures are within normal limits.  IMPRESSION: No evidence of acute cardiopulmonary disease.   Electronically Signed   By: Charline BillsSriyesh  Krishnan M.D.   On: 08/06/2014 20:25     EKG Interpretation None      MDM   Final diagnoses:  Hoarse  Acute exacerbation of emphysema    80 year old male with history of emphysema presents with hoarseness for 3 days. He's had 3 months of shortness of breath and productive coughing with white sputum. He's had no relief with Robitussin. He denies any chest pain or pressure. He denies any wheezing. He has inhalers at home with that he uses without relief. He is short of breath with talking. Here on exam he is externally cachectic. He scattered wheezing throughout his lungs with diminished lung sounds and air movement. His labs are normal. Will give steroids and give breathing treatments. His neck exam is normal there is no masses, swelling, tracheal deviation. He is not having any stridor. He is hoarse and posterior pharynx is normal. Xrays normal, no PNA. Will treat with antibiotic for bronchitis,  steroids, given albuterol for COPD exacerbation. Feeling much better after a duoneb. Stable for discharge.   Elwin MochaBlair Ketara Cavness, MD 08/07/14 [phone]

## 2015-01-07 ENCOUNTER — Encounter (HOSPITAL_COMMUNITY): Payer: Self-pay

## 2015-01-07 ENCOUNTER — Emergency Department (INDEPENDENT_AMBULATORY_CARE_PROVIDER_SITE_OTHER)
Admission: EM | Admit: 2015-01-07 | Discharge: 2015-01-07 | Disposition: A | Discharge status: Home / Self Care | Payer: Medicare Other | Source: Home / Self Care | Attending: Family Medicine | Admitting: Family Medicine

## 2015-01-07 DIAGNOSIS — H6123 Impacted cerumen, bilateral: Secondary | ICD-10-CM

## 2015-01-07 DIAGNOSIS — S46812A Strain of other muscles, fascia and tendons at shoulder and upper arm level, left arm, initial encounter: Secondary | ICD-10-CM

## 2015-01-07 DIAGNOSIS — M25512 Pain in left shoulder: Secondary | ICD-10-CM | POA: Diagnosis not present

## 2015-01-07 MED ORDER — DOCUSATE SODIUM 50 MG/5ML PO LIQD
ORAL | Status: AC
  Filled 2015-01-07: qty 10

## 2015-01-07 NOTE — Discharge Instructions (Signed)
Thank you for coming in today. Take up to 2 regular strength Tylenol every 6 hours for pain Follow-up with regular doctor   Adhesive Capsulitis Sometimes the shoulder becomes stiff and is painful to move. Some people say it feels as if the shoulder is frozen in place. Because of this, the condition is called "frozen shoulder." Its medical name is adhesive capsulitis.  The shoulder joint is made up of strong connective tissue that attaches the ball of the humerus to the shallow shoulder socket. This strong connective tissue is called the joint capsule. This tissue can become stiff and swollen. That is when adhesive capsulitis sets in. CAUSES  It is not always clear just what the cause adhesive capsulitis. Possibilities include:  Injury to the shoulder joint.  Strain. This is a repetitive injury brought about by overuse.  Lack of use. Perhaps your arm or hand was otherwise injured. It might have been in a sling for awhile. Or perhaps you were not using it to avoid pain.  Referred pain. This is a sort of trick the body plays. You feel pain in the shoulder. But, the pain actually comes from an injury somewhere else in the body.  Long-standing health problems. Several diseases can cause adhesive capsulitis. They include diabetes, heart disease, stroke, thyroid problems, rheumatoid arthritis and lung disease.  Being a women older than 40. Anyone can develop adhesive capsulitis but it is most common in women in this age group. SYMPTOMS   Pain.  It occurs when the arm is moved.  Parts of the shoulder might hurt if they are touched.  Pain is worse at night or when resting.  Soreness. It might not be strong enough to be called pain. But, the shoulder aches.  The shoulder does not move freely.  Muscle spasms.  Trouble sleeping because of shoulder ache or pain. DIAGNOSIS  To decide if you have adhesive capsulitis, your healthcare provider will probably:  Ask about symptoms you have  noticed.  Ask about your history of joint pain and anything that might have caused the pain.  Ask about your overall health.  Use hands to feel your shoulder and neck.  Ask you to move your shoulder in specific directions. This may indicate the origin of the pain.  Order imaging tests; pictures of the shoulder. They help pinpoint the source of the problem. An X-ray might be used. For more detail, an MRI is often used. An MRI details the tendons, muscles and ligaments as well as the joint. TREATMENT  Adhesive capsulitis can be treated several ways. Most treatments can be done in a clinic or in your healthcare provider's office. Be sure to discuss the different options with your caregiver. They include:  Physical therapy. You will work on specific exercises to get your shoulder moving again. The exercises usually involve stretching. A physical therapist (a caregiver with special training) can show you what to do and what not to do. The exercises will need to be done daily.  Medication.  Over-the-counter medicines may relieve pain and inflammation (the body's way of reacting to injury or infection).  Corticosteroids. These are stronger drugs to reduce pain and inflammation. They are given by injection (shots) into the shoulder joint. Frequent treatment is not recommended.  Muscle relaxants. Medication may be prescribed to ease muscle spasms.  Treatment of underlying conditions. This means treating another condition that is causing your shoulder problem. This might be a rotator cuff (tendon) problem  Shoulder manipulation. The shoulder will be moved  by your healthcare provider. You would be under general anesthesia (given a drug that puts you to sleep). You would not feel anything. Sometimes the joint will be injected with salt water (saline) at high pressure to break down internal scarring in the joint capsule.  Surgery. This is rarely needed. It may be suggested in advanced cases after all  other treatment has failed. PROGNOSIS  In time, most people recover from adhesive capsulitis. Sometimes, however, the pain goes away but full movement of the shoulder does not return.  HOME CARE INSTRUCTIONS   Take any pain medications recommended by your healthcare provider. Follow the directions carefully.  If you have physical therapy, follow through with the therapist's suggestions. Be sure you understand the exercises you will be doing. You should understand:  How often the exercises should be done.  How many times each exercise should be repeated.  How long they should be done.  What other activities you should do, or not do.  That you should warm up before doing any exercise. Just 5 to 10 minutes will help. Small, gentle movements should get your shoulder ready for more.  Avoid high-demand exercise that involves your shoulder such as throwing. This type of exercise can make pain worse.  Consider using cold packs. Cold may ease swelling and pain. Ask your healthcare provider if a cold pack might help you. If so, get directions on how and when to use them. SEEK MEDICAL CARE IF:   You have any questions about your medications.  Your pain continues to increase. Document Released: 07/27/2009 Document Revised: 12/22/2011 Document Reviewed: 07/27/2009 [facility] Patient Information 2015 [place], [place]. This information is not intended to replace advice given to you by your health care provider. Make sure you discuss any questions you have with your health care provider.

## 2015-01-07 NOTE — ED Provider Notes (Signed)
Jesus Carey is a 81 y.o. male who presents to Urgent Care today for left shoulder pain. Patient has chronic left shoulder and trapezius pain.  The pain is worsened slightly recently. He denies any injury. He notes difficulty with arm motion. He also notes decreased hearing and ear pressure bilaterally. No fevers or chills nausea vomiting or diarrhea.   Past Medical History  Diagnosis Date  . Hypertension   . Glaucoma    History reviewed. No pertinent past surgical history. History  Substance Use Topics  . Smoking status: Never Smoker   . Smokeless tobacco: Not on file  . Alcohol Use: No   ROS as above Medications: No current facility-administered medications for this encounter.   Current Outpatient Prescriptions  Medication Sig Dispense Refill  . albuterol (PROVENTIL HFA;VENTOLIN HFA) 108 (90 BASE) MCG/ACT inhaler Inhale 2 puffs into the lungs every 6 (six) hours as needed for wheezing.    Marland Kitchen. albuterol (PROVENTIL HFA;VENTOLIN HFA) 108 (90 BASE) MCG/ACT inhaler Inhale 2 puffs into the lungs every 4 (four) hours as needed for wheezing or shortness of breath. 1 Inhaler 2  . amLODipine (NORVASC) 10 MG tablet Take 10 mg by mouth daily.    Marland Kitchen. aspirin 81 MG tablet Take 81 mg by mouth daily.    . brimonidine (ALPHAGAN) 0.2 % ophthalmic solution Place 1 drop into the right eye 3 (three) times daily.     . brinzolamide (AZOPT) 1 % ophthalmic suspension Place 1 drop into the right eye 3 (three) times daily.     . budesonide-formoterol (SYMBICORT) 80-4.5 MCG/ACT inhaler Inhale 2 puffs into the lungs 2 (two) times daily.    . clopidogrel (PLAVIX) 75 MG tablet Take 75 mg by mouth daily.    Marland Kitchen. doxycycline (VIBRAMYCIN) 100 MG capsule Take 1 capsule (100 mg total) by mouth 2 (two) times daily. One po bid x 7 days 14 capsule 0  . guaifenesin (ROBITUSSIN) 100 MG/5ML syrup Take 10 mLs (200 mg total) by mouth every 4 (four) hours as needed for cough. 120 mL 0  . predniSONE (DELTASONE) 20 MG tablet Take 2  tablets (40 mg total) by mouth once. 8 tablet 0  . rosuvastatin (CRESTOR) 10 MG tablet Take 10 mg by mouth daily.    . tamsulosin (FLOMAX) 0.4 MG CAPS capsule Take 1 capsule (0.4 mg total) by mouth at bedtime. 30 capsule 0  . travoprost, benzalkonium, (TRAVATAN) 0.004 % ophthalmic solution Place 1 drop into both eyes at bedtime.      No Known Allergies   Exam:  BP 143/81 mmHg  Pulse 89  Temp(Src) 97.5 F (36.4 C) (Oral)  Resp 24  SpO2 95% Gen: Well NAD HEENT: EOMI,  MMM tympanic membranes are occluded by cerumen bilaterally. Upon removal of cerumen in the ear canals are mildly erythematous and some panic membranes are normal appearing. Patient felt much better. Lungs: Normal work of breathing. CTABL Heart: RRR no MRG Abd: NABS, Soft. Nondistended, Nontender Exts: Brisk capillary refill, warm and well perfused.  Neck: Nontender to spinal midline. Tender palpation left trapezius. Slightly decreased range of motion throughout. Left shoulder normal-appearing nontender. Range of motion significantly limited in external rotation and abduction. This is with active and passive. Internal rotation range of motion is normal. Unable to perform impingement testing due to improper positioning. Strength is diminished but equal bilateral shoulders. Pulses Refill sensation are intact bilateral upper extremities.  Cerumen was irrigated bilaterally. Patient felt better.  No results found for this or any previous visit (  from the past 24 hour(s)). No results found.  Assessment and Plan: 81 y.o. male with  1) left shoulder pain. Due to adhesive capsulitis versus significant DJD. Plan for Tylenol home exercise program and follow-up with PCP for further evaluation and management. 2) trapezius pain due to myofascial disruption. This is due to patient using his scapular motion to make up for his lack of glenohumeral range of motion. Same treatment as above 3) cerumen impaction bilaterally relieved. Patient  feels better.  Discussed warning signs or symptoms. Please see discharge instructions. Patient expresses understanding.     Rodolph Bong, MD 01/07/15 [phone]

## 2015-01-07 NOTE — ED Notes (Signed)
C/o pain in neck & left shoulder x 1 week. Denies injury . "It's old age"

## 2015-02-22 ENCOUNTER — Emergency Department (HOSPITAL_COMMUNITY)
Admission: EM | Admit: 2015-02-22 | Discharge: 2015-02-22 | Disposition: A | Discharge status: Home / Self Care | Payer: Medicare Other | Attending: Emergency Medicine | Admitting: Emergency Medicine

## 2015-02-22 ENCOUNTER — Emergency Department (HOSPITAL_COMMUNITY): Payer: Medicare Other

## 2015-02-22 DIAGNOSIS — J441 Chronic obstructive pulmonary disease with (acute) exacerbation: Secondary | ICD-10-CM | POA: Diagnosis not present

## 2015-02-22 DIAGNOSIS — Z7951 Long term (current) use of inhaled steroids: Secondary | ICD-10-CM | POA: Insufficient documentation

## 2015-02-22 DIAGNOSIS — H409 Unspecified glaucoma: Secondary | ICD-10-CM | POA: Diagnosis not present

## 2015-02-22 DIAGNOSIS — Z7982 Long term (current) use of aspirin: Secondary | ICD-10-CM | POA: Insufficient documentation

## 2015-02-22 DIAGNOSIS — I10 Essential (primary) hypertension: Secondary | ICD-10-CM | POA: Insufficient documentation

## 2015-02-22 DIAGNOSIS — Z7902 Long term (current) use of antithrombotics/antiplatelets: Secondary | ICD-10-CM | POA: Diagnosis not present

## 2015-02-22 DIAGNOSIS — Z79899 Other long term (current) drug therapy: Secondary | ICD-10-CM | POA: Diagnosis not present

## 2015-02-22 DIAGNOSIS — R0602 Shortness of breath: Secondary | ICD-10-CM | POA: Diagnosis present

## 2015-02-22 LAB — CBC WITH DIFFERENTIAL/PLATELET
Basophils Absolute: 0 10*3/uL (ref 0.0–0.1)
Basophils Relative: 0 % (ref 0–1)
Eosinophils Absolute: 0.3 10*3/uL (ref 0.0–0.7)
Eosinophils Relative: 6 % — ABNORMAL HIGH (ref 0–5)
HEMATOCRIT: 49.6 % (ref 39.0–52.0)
Hemoglobin: 15.9 g/dL (ref 13.0–17.0)
LYMPHS ABS: 2.7 10*3/uL (ref 0.7–4.0)
Lymphocytes Relative: 48 % — ABNORMAL HIGH (ref 12–46)
MCH: 29 pg (ref 26.0–34.0)
MCHC: 32.1 g/dL (ref 30.0–36.0)
MCV: 90.3 fL (ref 78.0–100.0)
Monocytes Absolute: 0.7 10*3/uL (ref 0.1–1.0)
Monocytes Relative: 12 % (ref 3–12)
NEUTROS PCT: 34 % — AB (ref 43–77)
Neutro Abs: 1.9 10*3/uL (ref 1.7–7.7)
Platelets: 121 10*3/uL — ABNORMAL LOW (ref 150–400)
RBC: 5.49 MIL/uL (ref 4.22–5.81)
RDW: 14.3 % (ref 11.5–15.5)
WBC: 5.7 10*3/uL (ref 4.0–10.5)

## 2015-02-22 LAB — COMPREHENSIVE METABOLIC PANEL
ALK PHOS: 88 U/L (ref 38–126)
ALT: 10 U/L — ABNORMAL LOW (ref 17–63)
AST: 20 U/L (ref 15–41)
Albumin: 4 g/dL (ref 3.5–5.0)
Anion gap: 14 (ref 5–15)
BUN: 18 mg/dL (ref 6–20)
CALCIUM: 9.2 mg/dL (ref 8.9–10.3)
CO2: 21 mmol/L — ABNORMAL LOW (ref 22–32)
Chloride: 107 mmol/L (ref 101–111)
Creatinine, Ser: 1.24 mg/dL (ref 0.61–1.24)
GFR calc non Af Amer: 48 mL/min — ABNORMAL LOW (ref >60)
GFR, EST AFRICAN AMERICAN: 56 mL/min — AB (ref >60)
GLUCOSE: 139 mg/dL — AB (ref 65–99)
POTASSIUM: 4 mmol/L (ref 3.5–5.1)
Sodium: 142 mmol/L (ref 135–145)
TOTAL PROTEIN: 7.4 g/dL (ref 6.5–8.1)
Total Bilirubin: 2.4 mg/dL — ABNORMAL HIGH (ref 0.3–1.2)

## 2015-02-22 LAB — I-STAT TROPONIN, ED: Troponin i, poc: 0.03 ng/mL (ref 0.00–0.08)

## 2015-02-22 LAB — BRAIN NATRIURETIC PEPTIDE: B Natriuretic Peptide: 44.7 pg/mL (ref 0.0–100.0)

## 2015-02-22 MED ORDER — METHYLPREDNISOLONE SODIUM SUCC 125 MG IJ SOLR
125.0000 mg | Freq: Once | INTRAMUSCULAR | Status: AC
  Administered 2015-02-22: 125 mg via INTRAVENOUS
  Filled 2015-02-22: qty 2

## 2015-02-22 MED ORDER — ALBUTEROL SULFATE HFA 108 (90 BASE) MCG/ACT IN AERS: 2.0000 | INHALATION_SPRAY | RESPIRATORY_TRACT | Status: DC | PRN

## 2015-02-22 MED ORDER — ALBUTEROL SULFATE HFA 108 (90 BASE) MCG/ACT IN AERS
2.0000 | INHALATION_SPRAY | RESPIRATORY_TRACT | Status: DC
  Filled 2015-02-22: qty 6.7

## 2015-02-22 MED ORDER — IPRATROPIUM BROMIDE 0.02 % IN SOLN
0.5000 mg | Freq: Once | RESPIRATORY_TRACT | Status: AC
  Administered 2015-02-22: 0.5 mg via RESPIRATORY_TRACT
  Filled 2015-02-22: qty 2.5

## 2015-02-22 MED ORDER — PREDNISONE 10 MG PO TABS: 60.0000 mg | ORAL_TABLET | Freq: Every day | ORAL | Status: DC

## 2015-02-22 MED ORDER — ALBUTEROL SULFATE (2.5 MG/3ML) 0.083% IN NEBU
5.0000 mg | INHALATION_SOLUTION | Freq: Once | RESPIRATORY_TRACT | Status: AC
  Administered 2015-02-22: 5 mg via RESPIRATORY_TRACT
  Filled 2015-02-22: qty 6

## 2015-02-22 NOTE — Discharge Instructions (Signed)

## 2015-02-22 NOTE — ED Provider Notes (Addendum)
CSN: [id number]     Arrival date & time 02/22/15  0417 History   First MD Initiated Contact with Patient 02/22/15 0421     Chief Complaint  Patient presents with  . Shortness of Breath      HPI Patient ports increasing shortness of breath over the past 2-3 days.  She's tried albuterol at home without improvement of symptoms.  He denies a history of COPD but is past medical history and prior visits to the emergency department and local office based practices would argue against this.  He has not Roxanol home.  He reports ongoing productive cough without fevers or chills.  No unilateral leg swelling.  No history DVT or pulmonary embolism.  He denies a history of tobacco abuse.  Patient reports thick productive mucus.  No history DVT or pulmonary embolism.  No unilateral leg swelling.   Past Medical History  Diagnosis Date  . Hypertension   . Glaucoma    No past surgical history on file. No family history on file. History  Substance Use Topics  . Smoking status: Never Smoker   . Smokeless tobacco: Not on file  . Alcohol Use: No    Review of Systems    Allergies  Review of patient's allergies indicates no known allergies.  Home Medications   Prior to Admission medications   Medication Sig Start Date End Date Taking? Authorizing Provider  amLODipine (NORVASC) 10 MG tablet Take 10 mg by mouth daily.   Yes Historical Provider, MD  aspirin 81 MG tablet Take 81 mg by mouth daily.   Yes Historical Provider, MD  brimonidine (ALPHAGAN) 0.2 % ophthalmic solution Place 1 drop into the right eye 3 (three) times daily.    Yes Historical Provider, MD  brinzolamide (AZOPT) 1 % ophthalmic suspension Place 1 drop into the right eye 3 (three) times daily.    Yes Historical Provider, MD  rosuvastatin (CRESTOR) 10 MG tablet Take 10 mg by mouth daily.   Yes Historical Provider, MD  travoprost, benzalkonium, (TRAVATAN) 0.004 % ophthalmic solution Place 1 drop into both eyes at bedtime.    Yes  Historical Provider, MD         budesonide-formoterol (SYMBICORT) 80-4.5 MCG/ACT inhaler Inhale 2 puffs into the lungs 2 (two) times daily. Patient needs a new prescription    Historical Provider, MD  clopidogrel (PLAVIX) 75 MG tablet Take 75 mg by mouth daily.    Historical Provider, MD         tamsulosin (FLOMAX) 0.4 MG CAPS capsule Take 1 capsule (0.4 mg total) by mouth at bedtime. 06/07/13   Hayden Rasmussenavid Mabe, NP   BP 135/90 mmHg  Pulse 79  Temp(Src) 97.8 F (36.6 C) (Oral)  Resp 21  Ht 5\' 3"  (1.6 m)  Wt 103 lb (46.72 kg)  BMI 18.25 kg/m2  SpO2 99% Physical Exam  Constitutional: He is oriented to person, place, and time. He appears well-developed and well-nourished.  HENT:  Head: Normocephalic and atraumatic.  Eyes: EOM are normal.  Neck: Normal range of motion.  Cardiovascular: Normal rate, regular rhythm, normal heart sounds and intact distal pulses.   Pulmonary/Chest: Effort normal. No respiratory distress. He has wheezes.  Abdominal: Soft. He exhibits no distension. There is no tenderness.  Musculoskeletal: Normal range of motion.  Neurological: He is alert and oriented to person, place, and time.  Skin: Skin is warm and dry.  Psychiatric: He has a normal mood and affect. Judgment normal.  Nursing note and vitals reviewed.  ED Course  Procedures (including critical care time) Labs Review Labs Reviewed  CBC WITH DIFFERENTIAL/PLATELET - Abnormal; Notable for the following:    Platelets 121 (*)    Neutrophils Relative % 34 (*)    Lymphocytes Relative 48 (*)    Eosinophils Relative 6 (*)    All other components within normal limits  COMPREHENSIVE METABOLIC PANEL - Abnormal; Notable for the following:    CO2 21 (*)    Glucose, Bld 139 (*)    ALT 10 (*)    Total Bilirubin 2.4 (*)    GFR calc non Af Amer 48 (*)    GFR calc Af Amer 56 (*)    All other components within normal limits  BRAIN NATRIURETIC PEPTIDE  I-STAT TROPOININ, ED    Imaging Review Dg Chest 2  View  02/22/2015   CLINICAL DATA:  Shortness of breath. History of shortness of breath for years. Nonsmoker.  EXAM: CHEST  2 VIEW  COMPARISON:  08/06/2014  FINDINGS: Pulmonary hyperinflation with scattered fibrosis in the lungs. Normal heart size and pulmonary vascularity. No focal airspace disease or consolidation in the lungs. No blunting of costophrenic angles. No pneumothorax. Mediastinal contours appear intact.  IMPRESSION: No active cardiopulmonary disease.   Electronically Signed   By: Burman NievesWilliam  Stevens M.D.   On: 02/22/2015 05:34     EKG Interpretation   Date/Time:  Thursday Feb 22 2015 04:25:08 EDT Ventricular Rate:  95 PR Interval:  204 QRS Duration: 102 QT Interval:  378 QTC Calculation: 475 R Axis:   69 Text Interpretation:  Sinus rhythm Ventricular premature complex  Borderline prolonged PR interval Right atrial enlargement Borderline  repolarization abnormality Borderline prolonged QT interval No significant  change was found Confirmed by Evonna Stoltz  MD, Nitasha Jewel ([id number]) on 02/22/2015  5:50:39 AM      MDM   Final diagnoses:  COPD exacerbation    6:55 AM Patient feels much better after albuterol and Atrovent.  Signed Medrol was given.  Chest x-ray is clear.  Doubt DVT/pulmonary embolism.  Cough is productive.  No fevers or chills at home.  Will avoid antibiotics at this point.  He will need to see the pulmonologist for close follow-up.  This was stressed to the patient and the patient's family.    Azalia BilisKevin Hillery Bhalla, MD 02/22/15 [phone]  Azalia BilisKevin Milind Raether, MD 03/13/15 [phone]

## 2015-02-22 NOTE — ED Notes (Signed)
Pt stable, denies any pain, family at bedside, states understanding of discharge instructions 

## 2015-03-15 ENCOUNTER — Emergency Department (HOSPITAL_COMMUNITY): Payer: Medicare Other

## 2015-03-15 ENCOUNTER — Encounter (HOSPITAL_COMMUNITY): Payer: Self-pay | Admitting: *Deleted

## 2015-03-15 ENCOUNTER — Emergency Department (HOSPITAL_COMMUNITY)
Admission: EM | Admit: 2015-03-15 | Discharge: 2015-03-16 | Disposition: A | Discharge status: Home / Self Care | Payer: Medicare Other | Attending: Emergency Medicine | Admitting: Emergency Medicine

## 2015-03-15 DIAGNOSIS — Z7982 Long term (current) use of aspirin: Secondary | ICD-10-CM | POA: Diagnosis not present

## 2015-03-15 DIAGNOSIS — H409 Unspecified glaucoma: Secondary | ICD-10-CM | POA: Diagnosis not present

## 2015-03-15 DIAGNOSIS — I10 Essential (primary) hypertension: Secondary | ICD-10-CM | POA: Diagnosis not present

## 2015-03-15 DIAGNOSIS — R0602 Shortness of breath: Secondary | ICD-10-CM | POA: Diagnosis present

## 2015-03-15 DIAGNOSIS — J441 Chronic obstructive pulmonary disease with (acute) exacerbation: Secondary | ICD-10-CM | POA: Diagnosis not present

## 2015-03-15 DIAGNOSIS — Z79899 Other long term (current) drug therapy: Secondary | ICD-10-CM | POA: Insufficient documentation

## 2015-03-15 HISTORY — DX: Emphysema, unspecified: J43.9

## 2015-03-15 LAB — CBC
HEMATOCRIT: 48.5 % (ref 39.0–52.0)
Hemoglobin: 15.4 g/dL (ref 13.0–17.0)
MCH: 28.6 pg (ref 26.0–34.0)
MCHC: 31.8 g/dL (ref 30.0–36.0)
MCV: 90.1 fL (ref 78.0–100.0)
Platelets: 130 10*3/uL — ABNORMAL LOW (ref 150–400)
RBC: 5.38 MIL/uL (ref 4.22–5.81)
RDW: 14.3 % (ref 11.5–15.5)
WBC: 5 10*3/uL (ref 4.0–10.5)

## 2015-03-15 LAB — BRAIN NATRIURETIC PEPTIDE: B NATRIURETIC PEPTIDE 5: 72 pg/mL (ref 0.0–100.0)

## 2015-03-15 LAB — BASIC METABOLIC PANEL
ANION GAP: 11 (ref 5–15)
BUN: 10 mg/dL (ref 6–20)
CO2: 25 mmol/L (ref 22–32)
Calcium: 9 mg/dL (ref 8.9–10.3)
Chloride: 104 mmol/L (ref 101–111)
Creatinine, Ser: 1.09 mg/dL (ref 0.61–1.24)
GFR calc Af Amer: >60 mL/min (ref >60)
GFR calc non Af Amer: 56 mL/min — ABNORMAL LOW (ref >60)
GLUCOSE: 170 mg/dL — AB (ref 65–99)
POTASSIUM: 3.6 mmol/L (ref 3.5–5.1)
Sodium: 140 mmol/L (ref 135–145)

## 2015-03-15 MED ORDER — PREDNISONE 20 MG PO TABS
60.0000 mg | ORAL_TABLET | Freq: Once | ORAL | Status: AC
  Administered 2015-03-15: 60 mg via ORAL
  Filled 2015-03-15: qty 3

## 2015-03-15 MED ORDER — GUAIFENESIN 100 MG/5ML PO LIQD: 100.0000 mg | Freq: Three times a day (TID) | ORAL | Status: DC | PRN

## 2015-03-15 MED ORDER — PREDNISONE 20 MG PO TABS: ORAL_TABLET | ORAL | Status: DC

## 2015-03-15 MED ORDER — ALBUTEROL (5 MG/ML) CONTINUOUS INHALATION SOLN
10.0000 mg/h | INHALATION_SOLUTION | Freq: Once | RESPIRATORY_TRACT | Status: AC
  Administered 2015-03-15: 10 mg/h via RESPIRATORY_TRACT
  Filled 2015-03-15: qty 40
  Filled 2015-03-15 (×2): qty 20

## 2015-03-15 MED ORDER — DOXYCYCLINE HYCLATE 100 MG PO TABS
100.0000 mg | ORAL_TABLET | Freq: Once | ORAL | Status: AC
  Administered 2015-03-15: 100 mg via ORAL
  Filled 2015-03-15: qty 1

## 2015-03-15 MED ORDER — METHYLPREDNISOLONE SODIUM SUCC 125 MG IJ SOLR
125.0000 mg | Freq: Once | INTRAMUSCULAR | Status: AC
Start: 2015-03-15 — End: 2015-03-15
  Administered 2015-03-15: 125 mg via INTRAVENOUS
  Filled 2015-03-15: qty 2

## 2015-03-15 MED ORDER — IPRATROPIUM BROMIDE 0.02 % IN SOLN
0.5000 mg | Freq: Once | RESPIRATORY_TRACT | Status: AC
  Administered 2015-03-15: 0.5 mg via RESPIRATORY_TRACT
  Filled 2015-03-15: qty 2.5

## 2015-03-15 MED ORDER — DOXYCYCLINE HYCLATE 100 MG PO CAPS: 100.0000 mg | ORAL_CAPSULE | Freq: Two times a day (BID) | ORAL | Status: DC

## 2015-03-15 MED ORDER — IPRATROPIUM-ALBUTEROL 0.5-2.5 (3) MG/3ML IN SOLN
RESPIRATORY_TRACT | Status: AC
  Filled 2015-03-15: qty 3

## 2015-03-15 MED ORDER — IPRATROPIUM-ALBUTEROL 0.5-2.5 (3) MG/3ML IN SOLN
3.0000 mL | Freq: Once | RESPIRATORY_TRACT | Status: AC
  Administered 2015-03-15: 3 mL via RESPIRATORY_TRACT
  Filled 2015-03-15: qty 3

## 2015-03-15 NOTE — Discharge Instructions (Signed)

## 2015-03-15 NOTE — ED Notes (Signed)
Pt c/o SOB x 1 week. States that he has been using all of his prescribed medications with no relief. States that he was seen here recently for the same and he will get a little better but then it flares up again. States he has a hx of emphysema.

## 2015-03-15 NOTE — ED Provider Notes (Signed)
CSN: [id number]     Arrival date & time 03/15/15  2035 History   First MD Initiated Contact with Patient 03/15/15 2100     Chief Complaint  Patient presents with  . Shortness of Breath     (Consider location/radiation/quality/duration/timing/severity/associated sxs/prior Treatment) HPI Comments: Reports worsening SOB. Hx of chronic emphysema. Hasn't had pneumonia recently. He states no relief with his home inhalers.  Patient is a 81 y.o. male presenting with shortness of breath. The history is provided by the patient.  Shortness of Breath Severity:  Moderate Onset quality:  Gradual Timing:  Constant Progression:  Unchanged Chronicity:  Chronic Context: URI   Relieved by:  Nothing Worsened by:  Nothing tried Associated symptoms: no abdominal pain, no chest pain, no cough, no fever and no vomiting     Past Medical History  Diagnosis Date  . Hypertension   . Glaucoma   . Emphysema lung    History reviewed. No pertinent past surgical history. No family history on file. History  Substance Use Topics  . Smoking status: Never Smoker   . Smokeless tobacco: Not on file  . Alcohol Use: No    Review of Systems  Constitutional: Negative for fever and chills.  Respiratory: Negative for cough and shortness of breath.   Cardiovascular: Negative for chest pain and leg swelling.  Gastrointestinal: Negative for vomiting and abdominal pain.  All other systems reviewed and are negative.     Allergies  Review of patient's allergies indicates no known allergies.  Home Medications   Prior to Admission medications   Medication Sig Start Date End Date Taking? Authorizing Provider  albuterol (PROVENTIL HFA;VENTOLIN HFA) 108 (90 BASE) MCG/ACT inhaler Inhale 2 puffs into the lungs every 4 (four) hours as needed for wheezing or shortness of breath. 02/22/15  Yes Azalia Bilis, MD  amLODipine (NORVASC) 10 MG tablet Take 10 mg by mouth daily.   Yes Historical Provider, MD  aspirin 81 MG  tablet Take 81 mg by mouth daily.   Yes Historical Provider, MD  brimonidine (ALPHAGAN) 0.2 % ophthalmic solution Place 1 drop into the right eye 3 (three) times daily.    Yes Historical Provider, MD  brinzolamide (AZOPT) 1 % ophthalmic suspension Place 1 drop into both eyes 3 (three) times daily.    Yes Historical Provider, MD  rosuvastatin (CRESTOR) 10 MG tablet Take 10 mg by mouth daily.   Yes Historical Provider, MD  travoprost, benzalkonium, (TRAVATAN) 0.004 % ophthalmic solution Place 1 drop into both eyes at bedtime.    Yes Historical Provider, MD  tamsulosin (FLOMAX) 0.4 MG CAPS capsule Take 1 capsule (0.4 mg total) by mouth at bedtime. 06/07/13   Hayden Rasmussen, NP   BP 144/82 mmHg  Pulse 82  Temp(Src) 97.6 F (36.4 C) (Oral)  Resp 26  SpO2 97% Physical Exam  Constitutional: He is oriented to person, place, and time. He appears well-developed and well-nourished. No distress.  HENT:  Head: Normocephalic and atraumatic.  Mouth/Throat: Oropharynx is clear and moist. No oropharyngeal exudate.  Eyes: EOM are normal. Pupils are equal, round, and reactive to light.  Neck: Normal range of motion. Neck supple.  Cardiovascular: Normal rate and regular rhythm.  Exam reveals no friction rub.   No murmur heard. Pulmonary/Chest: Effort normal. No respiratory distress. He has wheezes (diffuse, moderate). He has no rales.  Abdominal: Soft. He exhibits no distension. There is no tenderness. There is no rebound.  Musculoskeletal: Normal range of motion. He exhibits no edema.  Neurological: He  is alert and oriented to person, place, and time. No cranial nerve deficit. He exhibits normal muscle tone. Coordination normal.  Skin: No rash noted. He is not diaphoretic.  Nursing note and vitals reviewed.   ED Course  Procedures (including critical care time) Labs Review Labs Reviewed  BASIC METABOLIC PANEL - Abnormal; Notable for the following:    Glucose, Bld 170 (*)    GFR calc non Af Amer 56 (*)     All other components within normal limits  CBC - Abnormal; Notable for the following:    Platelets 130 (*)    All other components within normal limits  BRAIN NATRIURETIC PEPTIDE    Imaging Review Dg Chest 2 View (if Patient Has Fever And/or Copd)  03/15/2015   CLINICAL DATA:  Shortness of breath for 2 weeks.  EXAM: CHEST  2 VIEW  COMPARISON:  02/22/2015  FINDINGS: The lungs remain hyperinflated with probable emphysema. The heart size is normal, mediastinal contours are unchanged with mild tortuosity of the thoracic aorta. There is no pulmonary edema, consolidation, pleural effusion or pneumothorax. Presumed nipple shadow projects over the 5-6 interspace on the left, and is unchanged from prior exam. The bones are under mineralized, no acute osseous abnormality.  IMPRESSION: 1.  No acute pulmonary process. 2. Emphysema.   Electronically Signed   By: Rubye OaksMelanie  Ehinger M.D.   On: 03/15/2015 21:14     EKG Interpretation   Date/Time:  Thursday March 15 2015 21:51:24 EDT Ventricular Rate:  70 PR Interval:  195 QRS Duration: 98 QT Interval:  388 QTC Calculation: 419 R Axis:   33 Text Interpretation:  Sinus rhythm Ventricular premature complex  Nonspecific T abnormalities, lateral leads No significant change since  last tracing Confirmed by Gwendolyn GrantWALDEN  MD, Yaelis Scharfenberg ([id number]) on 03/15/2015 9:54:47 PM      MDM   Final diagnoses:  COPD exacerbation    28M here with SOB. Hx of emphysema. No relief with inhalers at home. Reports cough with thick mucus he's having trouble getting up. Afebrile, vital signs are stable except for tachypnea. Patient has diffuse wheezes with decreased air movement throughout. Plan for breathing treatments. Xray negative for PNA. Feeling better after a breathing treatment. States he still has a productive cough. Will give mucinex, antibiotics. He's looking better, tachypnea resolved. Stable for discharge.   Elwin MochaBlair Harvard Zeiss, MD 03/15/15 [phone]

## 2015-03-15 NOTE — ED Notes (Signed)
Pt stable, ambulatory, states understanding of discharge instructions 

## 2015-04-03 ENCOUNTER — Encounter (HOSPITAL_COMMUNITY): Payer: Self-pay | Admitting: Emergency Medicine

## 2015-04-03 ENCOUNTER — Inpatient Hospital Stay (HOSPITAL_COMMUNITY)
Admission: EM | Admit: 2015-04-03 | Discharge: 2015-04-04 | DRG: 191 | Disposition: A | Discharge status: Home / Self Care | Payer: Medicare Other | Attending: Internal Medicine | Admitting: Internal Medicine

## 2015-04-03 ENCOUNTER — Emergency Department (HOSPITAL_COMMUNITY): Payer: Medicare Other

## 2015-04-03 DIAGNOSIS — Z7982 Long term (current) use of aspirin: Secondary | ICD-10-CM

## 2015-04-03 DIAGNOSIS — I10 Essential (primary) hypertension: Secondary | ICD-10-CM | POA: Diagnosis present

## 2015-04-03 DIAGNOSIS — E785 Hyperlipidemia, unspecified: Secondary | ICD-10-CM | POA: Diagnosis present

## 2015-04-03 DIAGNOSIS — J441 Chronic obstructive pulmonary disease with (acute) exacerbation: Principal | ICD-10-CM | POA: Diagnosis present

## 2015-04-03 DIAGNOSIS — J9600 Acute respiratory failure, unspecified whether with hypoxia or hypercapnia: Secondary | ICD-10-CM

## 2015-04-03 DIAGNOSIS — J9612 Chronic respiratory failure with hypercapnia: Secondary | ICD-10-CM | POA: Diagnosis present

## 2015-04-03 DIAGNOSIS — Z79899 Other long term (current) drug therapy: Secondary | ICD-10-CM

## 2015-04-03 DIAGNOSIS — N400 Enlarged prostate without lower urinary tract symptoms: Secondary | ICD-10-CM | POA: Diagnosis not present

## 2015-04-03 DIAGNOSIS — Z8673 Personal history of transient ischemic attack (TIA), and cerebral infarction without residual deficits: Secondary | ICD-10-CM

## 2015-04-03 DIAGNOSIS — R0602 Shortness of breath: Secondary | ICD-10-CM | POA: Diagnosis not present

## 2015-04-03 DIAGNOSIS — I639 Cerebral infarction, unspecified: Secondary | ICD-10-CM | POA: Diagnosis present

## 2015-04-03 DIAGNOSIS — J9602 Acute respiratory failure with hypercapnia: Secondary | ICD-10-CM | POA: Diagnosis present

## 2015-04-03 DIAGNOSIS — H409 Unspecified glaucoma: Secondary | ICD-10-CM | POA: Diagnosis present

## 2015-04-03 HISTORY — DX: Cerebral infarction, unspecified: I63.9

## 2015-04-03 HISTORY — DX: Chronic obstructive pulmonary disease, unspecified: J44.9

## 2015-04-03 HISTORY — DX: Benign prostatic hyperplasia without lower urinary tract symptoms: N40.0

## 2015-04-03 LAB — BASIC METABOLIC PANEL
Anion gap: 12 (ref 5–15)
BUN: 9 mg/dL (ref 6–20)
CALCIUM: 9.4 mg/dL (ref 8.9–10.3)
CO2: 28 mmol/L (ref 22–32)
Chloride: 104 mmol/L (ref 101–111)
Creatinine, Ser: 0.93 mg/dL (ref 0.61–1.24)
GFR calc Af Amer: >60 mL/min (ref >60)
GLUCOSE: 115 mg/dL — AB (ref 65–99)
Potassium: 3.5 mmol/L (ref 3.5–5.1)
SODIUM: 144 mmol/L (ref 135–145)

## 2015-04-03 LAB — CBC WITH DIFFERENTIAL/PLATELET
Basophils Absolute: 0 10*3/uL (ref 0.0–0.1)
Basophils Relative: 0 % (ref 0–1)
EOS PCT: 4 % (ref 0–5)
Eosinophils Absolute: 0.2 10*3/uL (ref 0.0–0.7)
HCT: 46.4 % (ref 39.0–52.0)
HEMOGLOBIN: 15 g/dL (ref 13.0–17.0)
Lymphocytes Relative: 49 % — ABNORMAL HIGH (ref 12–46)
Lymphs Abs: 2.8 10*3/uL (ref 0.7–4.0)
MCH: 29.4 pg (ref 26.0–34.0)
MCHC: 32.3 g/dL (ref 30.0–36.0)
MCV: 91 fL (ref 78.0–100.0)
MONO ABS: 0.7 10*3/uL (ref 0.1–1.0)
MONOS PCT: 12 % (ref 3–12)
Neutro Abs: 2 10*3/uL (ref 1.7–7.7)
Neutrophils Relative %: 35 % — ABNORMAL LOW (ref 43–77)
Platelets: 128 10*3/uL — ABNORMAL LOW (ref 150–400)
RBC: 5.1 MIL/uL (ref 4.22–5.81)
RDW: 14.4 % (ref 11.5–15.5)
WBC: 5.6 10*3/uL (ref 4.0–10.5)

## 2015-04-03 LAB — I-STAT ARTERIAL BLOOD GAS, ED
Acid-Base Excess: 3 mmol/L — ABNORMAL HIGH (ref 0.0–2.0)
BICARBONATE: 30.1 meq/L — AB (ref 20.0–24.0)
O2 Saturation: 100 %
Patient temperature: 97.8
TCO2: 32 mmol/L (ref 0–100)
pCO2 arterial: 53.6 mmHg — ABNORMAL HIGH (ref 35.0–45.0)
pH, Arterial: 7.355 (ref 7.350–7.450)
pO2, Arterial: 253 mmHg — ABNORMAL HIGH (ref 80.0–100.0)

## 2015-04-03 MED ORDER — ALBUTEROL (5 MG/ML) CONTINUOUS INHALATION SOLN
10.0000 mg/h | INHALATION_SOLUTION | Freq: Once | RESPIRATORY_TRACT | Status: AC
  Administered 2015-04-03: 10 mg/h via RESPIRATORY_TRACT
  Filled 2015-04-03: qty 20

## 2015-04-03 NOTE — Progress Notes (Signed)
Pt came in on CPAP, RT assessed pt and placed pt on BiPAP at 10/5 with 50% O2 pt tolerating well at this time. RT will continue to monitor

## 2015-04-03 NOTE — ED Notes (Signed)
Per EMS- Pt w hx of COPD. Pt called EMS for respiratory distress for about 2 hours. Placed on CPAP. 18G PIV placed- .5 atrovent 5 albuterol neb through 125 solumedrol 2g Mag Sulfate given PTA. Wheezing to right upper and lower lobes, left lung sounds with minimal air heard.

## 2015-04-03 NOTE — ED Notes (Signed)
EKG completed given to EDP.  

## 2015-04-03 NOTE — ED Provider Notes (Signed)
CSN: [id number]     Arrival date & time 04/03/15  2130 History   First MD Initiated Contact with Patient 04/03/15 2142     Chief Complaint  Patient presents with  . Respiratory Distress  . COPD   LEVEL 5 CAVEAT DUE TO RESPIRATORY DISTRESS  Patient is a 81 y.o. male presenting with shortness of breath. The history is provided by the patient.  Shortness of Breath Severity:  Severe Onset quality:  Sudden Duration:  2 hours Timing:  Constant Progression:  Worsening Chronicity:  Recurrent Relieved by: cpap. Worsened by:  Nothing tried Associated symptoms: cough   Associated symptoms: no chest pain   PT presents from home for acute onset of SOB Pt has h/o COPD and noted increased SOB EMS was called and patient was in distress, he was placed on CPAP He was also given albuterol/atrovent/magnesium/solumedrol   Past Medical History  Diagnosis Date  . Hypertension   . Glaucoma   . Emphysema lung    History reviewed. No pertinent past surgical history. History reviewed. No pertinent family history. History  Substance Use Topics  . Smoking status: Never Smoker   . Smokeless tobacco: Not on file  . Alcohol Use: No    Review of Systems  Unable to perform ROS: Mental status change  Respiratory: Positive for cough and shortness of breath.   Cardiovascular: Negative for chest pain.      Allergies  Review of patient's allergies indicates no known allergies.  Home Medications   Prior to Admission medications   Medication Sig Start Date End Date Taking? Authorizing Provider  albuterol (PROVENTIL HFA;VENTOLIN HFA) 108 (90 BASE) MCG/ACT inhaler Inhale 2 puffs into the lungs every 4 (four) hours as needed for wheezing or shortness of breath. 02/22/15  Yes Azalia Bilis, MD  amLODipine (NORVASC) 10 MG tablet Take 10 mg by mouth daily.   Yes Historical Provider, MD  aspirin 81 MG tablet Take 81 mg by mouth daily.   Yes Historical Provider, MD  brimonidine (ALPHAGAN) 0.2 % ophthalmic  solution Place 1 drop into the right eye 3 (three) times daily.    Yes Historical Provider, MD  brinzolamide (AZOPT) 1 % ophthalmic suspension Place 1 drop into both eyes 3 (three) times daily.    Yes Historical Provider, MD  guaiFENesin (ROBITUSSIN) 100 MG/5ML liquid Take 5-10 mLs (100-200 mg total) by mouth 3 (three) times daily as needed for cough. 03/15/15  Yes Elwin Mocha, MD  rosuvastatin (CRESTOR) 10 MG tablet Take 10 mg by mouth daily.   Yes Historical Provider, MD  tamsulosin (FLOMAX) 0.4 MG CAPS capsule Take 1 capsule (0.4 mg total) by mouth at bedtime. 06/07/13  Yes Hayden Rasmussen, NP  travoprost, benzalkonium, (TRAVATAN) 0.004 % ophthalmic solution Place 1 drop into both eyes at bedtime.    Yes Historical Provider, MD   BP 132/78 mmHg  Pulse 71  Temp(Src) 97.8 F (36.6 C) (Axillary)  Resp 18  Ht 5\' 7"  (1.702 m)  Wt 103 lb (46.72 kg)  BMI 16.13 kg/m2  SpO2 100% Physical Exam CONSTITUTIONAL: elderly, ill appearing HEAD: Normocephalic/atraumatic EYES: EOMI ENMT: Mucous membranes moist NECK: supple no meningeal signs SPINE/BACK:entire spine nontender CV: S1/S2 noted, no murmurs/rubs/gallops noted LUNGS: tachypneic, wheezing bilaterally ABDOMEN: soft, nontender, no rebound or guarding, bowel sounds noted throughout abdomen NEURO: Pt is awake/alert/appropriate, moves all extremitiesx4.  No facial droop.   EXTREMITIES: pulses normal/equal, full ROM, no LE edema SKIN: warm, color normal PSYCH: unable to assess  ED Course  Procedures  CRITICAL CARE Performed by: Joya Gaskins Total critical care time: 31 Critical care time was exclusive of separately billable procedures and treating other patients. Critical care was necessary to treat or prevent imminent or life-threatening deterioration. Critical care was time spent personally by me on the following activities: development of treatment plan with patient and/or surrogate as well as nursing, discussions with consultants,  evaluation of patient's response to treatment, examination of patient, obtaining history from patient or surrogate, ordering and performing treatments and interventions, ordering and review of laboratory studies, ordering and review of radiographic studies, pulse oximetry and re-evaluation of patient's condition. PATIENT WITH COPD EXACERBATION REQUIRING NON-INVASIVE VENTILATION AND STEPDOWN ADMISSION Labs Review Labs Reviewed  BASIC METABOLIC PANEL - Abnormal; Notable for the following:    Glucose, Bld 115 (*)    All other components within normal limits  CBC WITH DIFFERENTIAL/PLATELET - Abnormal; Notable for the following:    Platelets 128 (*)    Neutrophils Relative % 35 (*)    Lymphocytes Relative 49 (*)    All other components within normal limits  I-STAT ARTERIAL BLOOD GAS, ED - Abnormal; Notable for the following:    pCO2 arterial 53.6 (*)    pO2, Arterial 253.0 (*)    Bicarbonate 30.1 (*)    Acid-Base Excess 3.0 (*)    All other components within normal limits    Imaging Review Dg Chest Portable 1 View  04/03/2015   CLINICAL DATA:  Increasing shortness of breath, respiratory distress  EXAM: PORTABLE CHEST - 1 VIEW  COMPARISON:  03/15/2015  FINDINGS: Chronic interstitial markings/ emphysematous changes. No focal consolidation. No pleural effusion or pneumothorax.  The heart is normal in size.  IMPRESSION: No evidence of acute cardiopulmonary disease.   Electronically Signed   By: Charline Bills M.D.   On: 04/03/2015 22:01     EKG Interpretation   Date/Time:  Tuesday April 03 2015 21:54:20 EDT Ventricular Rate:  77 PR Interval:  176 QRS Duration: 106 QT Interval:  379 QTC Calculation: 429 R Axis:   25 Text Interpretation:  Sinus rhythm Multiform ventricular premature  complexes Nonspecific T abnormalities, lateral leads Confirmed by Bebe Shaggy   MD, Dorinda Hill ([id number]) on 04/03/2015 10:04:47 PM     Medications  albuterol (PROVENTIL,VENTOLIN) solution continuous neb (not  administered)   11:36 PM Pt clinically improved He was given multiple meds by EMS and after CPAP applied by EMS he dramatically improved He continues to improve in the ED on non-invasive ventilation Patient/family updated on plan  I spoke to hospitalist dr Clyde Lundborg We will admit patient to stepdown unit  MDM   Final diagnoses:  Chronic obstructive pulmonary disease with acute exacerbation  Acute respiratory failure, unspecified whether with hypoxia or hypercapnia    Nursing notes including past medical history and social history reviewed and considered in documentation xrays/imaging reviewed by myself and considered during evaluation Labs/vital reviewed myself and considered during evaluation Previous records reviewed and considered     Zadie Rhine, MD 04/03/15 2337

## 2015-04-03 NOTE — H&P (Signed)
[facility] History and Physical  Jesus Carey ZOX:[id number] DOB: 12-25-1919 DOA: 04/03/2015  Referring physician: ED physician PCP: Burtis Junes, MD  Specialists:   Chief Complaint: Productive cough and shortness of breath  HPI: Jesus Carey is a 81 y.o. male with PMH of COPD, emphysema, hypertension, hyperlipidemia, BPH, who presents with productive cough and shortness breast.  He reports that he has a chronic cough which has been progressively getting worse since yesterday. He coughs up very thick white colored sputum. No chest pain, fever or chills. He also has worsening shortness of breath. No abdominal pain, diarrhea, symptoms of UTI. No leg edema, unilateral weakness.  In ED, patient was found to have WBC 5.6, normal temperature, heart rate 75, electrolytes okay. ABG showed pH 7.355, PCO2 57.6, PO2 253. Negative chest x-ray. Patient is admitted to the patient for further evaluation and treatment.  Where does patient live?   At home  Can patient participate in ADLs? Barely   Review of Systems:   General: no fevers, chills, no changes in body weight, has poor appetite, has fatigue HEENT: no blurry vision, hearing changes or sore throat Pulm: has dyspnea, coughing, wheezing CV: no chest pain, palpitations Abd: no nausea, vomiting, abdominal pain, diarrhea, constipation GU: no dysuria, burning on urination, increased urinary frequency, hematuria  Ext: no leg edema Neuro: no unilateral weakness, numbness, or tingling, no vision change or hearing loss Skin: no rash MSK: No muscle spasm, no deformity, no limitation of range of movement in spin Heme: No easy bruising.  Travel history: No recent long distant travel.  Allergy: No Known Allergies  Past Medical History  Diagnosis Date  . Hypertension   . Glaucoma   . Emphysema lung   . COPD (chronic obstructive pulmonary disease)   . CVA (cerebral infarction)   . BPH (benign prostatic hyperplasia)      History reviewed. No pertinent past surgical history.  Social History:  reports that he has never smoked. He does not have any smokeless tobacco history on file. He reports that he does not drink alcohol or use illicit drugs.  Family History: asked, but patient does not remember any family medical history.   Prior to Admission medications   Medication Sig Start Date End Date Taking? Authorizing Provider  albuterol (PROVENTIL HFA;VENTOLIN HFA) 108 (90 BASE) MCG/ACT inhaler Inhale 2 puffs into the lungs every 4 (four) hours as needed for wheezing or shortness of breath. 02/22/15  Yes Azalia Bilis, MD  amLODipine (NORVASC) 10 MG tablet Take 10 mg by mouth daily.   Yes Historical Provider, MD  aspirin 81 MG tablet Take 81 mg by mouth daily.   Yes Historical Provider, MD  brimonidine (ALPHAGAN) 0.2 % ophthalmic solution Place 1 drop into the right eye 3 (three) times daily.    Yes Historical Provider, MD  brinzolamide (AZOPT) 1 % ophthalmic suspension Place 1 drop into both eyes 3 (three) times daily.    Yes Historical Provider, MD  guaiFENesin (ROBITUSSIN) 100 MG/5ML liquid Take 5-10 mLs (100-200 mg total) by mouth 3 (three) times daily as needed for cough. 03/15/15  Yes Elwin Mocha, MD  rosuvastatin (CRESTOR) 10 MG tablet Take 10 mg by mouth daily.   Yes Historical Provider, MD  tamsulosin (FLOMAX) 0.4 MG CAPS capsule Take 1 capsule (0.4 mg total) by mouth at bedtime. 06/07/13  Yes Hayden Rasmussen, NP  travoprost, benzalkonium, (TRAVATAN) 0.004 % ophthalmic solution Place 1 drop into both eyes at bedtime.    Yes Historical Provider, MD  doxycycline (VIBRAMYCIN) 100 MG capsule Take 1 capsule (100 mg total) by mouth 2 (two) times daily. Patient not taking: Reported on 04/03/2015 03/15/15   Elwin Mocha, MD  predniSONE (DELTASONE) 20 MG tablet 2 tabs po daily x 4 days Patient not taking: Reported on 04/03/2015 03/15/15   Elwin Mocha, MD    Physical Exam: Filed Vitals:   04/04/15 0114 04/04/15 0115  04/04/15 0130 04/04/15 0145  BP: 122/76 125/83 129/78 113/79  Pulse: 86 84 91 90  Temp:      TempSrc:      Resp: 19 18 22 18   Height:      Weight:      SpO2: 95% 96% 96% 96%   General: Not in acute distress. HEENT:       Eyes: PERRL, EOMI, no scleral icterus.       ENT: No discharge from the ears and nose, no pharynx injection, no tonsillar enlargement.        Neck: No JVD, no bruit, no mass felt. Heme: No neck lymph node enlargement. Cardiac: S1/S2, RRR, No murmurs, No gallops or rubs. Pulm: Decrease air movement bilaterally. Has mild wheezing, but no raleor rubs. Abd: Soft, nondistended, nontender, no rebound pain, no organomegaly, BS present. Ext: No pitting leg edema bilaterally. 2+DP/PT pulse bilaterally. Musculoskeletal: No joint deformities, No joint redness or warmth, no limitation of ROM in spin. Skin: No rashes.  Neuro: Alert, oriented X3, cranial nerves II-XII grossly intact, muscle strength 4/5 in all extremities, symmetric, sensation to light touch intact.  Psych: Patient is not psychotic, no suicidal or hemocidal ideation.  Labs on Admission:  Basic Metabolic Panel:  Recent Labs Lab 04/03/15 2137  NA 144  K 3.5  CL 104  CO2 28  GLUCOSE 115*  BUN 9  CREATININE 0.93  CALCIUM 9.4   Liver Function Tests: No results for input(s): AST, ALT, ALKPHOS, BILITOT, PROT, ALBUMIN in the last 168 hours. No results for input(s): LIPASE, AMYLASE in the last 168 hours. No results for input(s): AMMONIA in the last 168 hours. CBC:  Recent Labs Lab 04/03/15 2137  WBC 5.6  NEUTROABS 2.0  HGB 15.0  HCT 46.4  MCV 91.0  PLT 128*   Cardiac Enzymes: No results for input(s): CKTOTAL, CKMB, CKMBINDEX, TROPONINI in the last 168 hours.  BNP (last 3 results)  Recent Labs  02/22/15 0446 03/15/15 2155  BNP 44.7 72.0    ProBNP (last 3 results)  Recent Labs  08/06/14 1849  PROBNP 197.7    CBG: No results for input(s): GLUCAP in the last 168  hours.  Radiological Exams on Admission: Dg Chest Portable 1 View  04/03/2015   CLINICAL DATA:  Increasing shortness of breath, respiratory distress  EXAM: PORTABLE CHEST - 1 VIEW  COMPARISON:  03/15/2015  FINDINGS: Chronic interstitial markings/ emphysematous changes. No focal consolidation. No pleural effusion or pneumothorax.  The heart is normal in size.  IMPRESSION: No evidence of acute cardiopulmonary disease.   Electronically Signed   By: Charline Bills M.D.   On: 04/03/2015 22:01    EKG: Independently reviewed.  Abnormal findings: Mild T-wave inversion in V5 to V6, occasional PVC  Assessment/Plan Principal Problem:   COPD exacerbation Active Problems:   HLD (hyperlipidemia)   Essential hypertension   Stroke   BPH (benign prostatic hyperplasia)   Acute on chronic respiratory failure and COPD exacerbation: Patient shortness of breath, productive cough and wheezing are consistent with COPD exacerbation. Chest x-ray has no pneumonia. Pulmonary embolism is less likely  given no any chest pain. Patient has no history of CHF, no leg edema on admission.  -will admit patient to SDU -Nebulizers: scheduled Duoneb and prn albuterol -Solu-Medrol 60 mg IV q8h  -Oral levaquin for 5 days.  -Mucinex for cough  -Urine legionella and S. pneumococcal antigen -Follow up blood culture x2, sputum culture and Flu pcr -PRN BiPAP  HLD: Last LDL was 40 on 03/30/10 -Continue home medications: Crestor  BPH: stable - Continue Flomax  Essential hypertension: -continue amlodipine  Stroke: No acute heat use. -Aspirin   DVT ppx: SQ Heparin     Code Status: Full code Family Communication: Yes, patient's  son     at bed side Disposition Plan: Admit to inpatient   Date of Service 04/04/2015    Lorretta Harp [facility] Pager [phone]  If 7PM-7AM, please contact night-coverage [email or web address] Password [facility] 04/04/2015, 2:27 AM

## 2015-04-04 ENCOUNTER — Encounter (HOSPITAL_COMMUNITY): Payer: Self-pay | Admitting: Internal Medicine

## 2015-04-04 DIAGNOSIS — H409 Unspecified glaucoma: Secondary | ICD-10-CM | POA: Diagnosis present

## 2015-04-04 DIAGNOSIS — N400 Enlarged prostate without lower urinary tract symptoms: Secondary | ICD-10-CM

## 2015-04-04 DIAGNOSIS — J9602 Acute respiratory failure with hypercapnia: Secondary | ICD-10-CM

## 2015-04-04 DIAGNOSIS — Z8673 Personal history of transient ischemic attack (TIA), and cerebral infarction without residual deficits: Secondary | ICD-10-CM | POA: Diagnosis not present

## 2015-04-04 DIAGNOSIS — J9612 Chronic respiratory failure with hypercapnia: Secondary | ICD-10-CM | POA: Diagnosis present

## 2015-04-04 DIAGNOSIS — R0602 Shortness of breath: Secondary | ICD-10-CM | POA: Diagnosis present

## 2015-04-04 DIAGNOSIS — J441 Chronic obstructive pulmonary disease with (acute) exacerbation: Principal | ICD-10-CM

## 2015-04-04 DIAGNOSIS — Z7982 Long term (current) use of aspirin: Secondary | ICD-10-CM | POA: Diagnosis not present

## 2015-04-04 DIAGNOSIS — I639 Cerebral infarction, unspecified: Secondary | ICD-10-CM

## 2015-04-04 DIAGNOSIS — Z79899 Other long term (current) drug therapy: Secondary | ICD-10-CM | POA: Diagnosis not present

## 2015-04-04 DIAGNOSIS — I10 Essential (primary) hypertension: Secondary | ICD-10-CM | POA: Diagnosis present

## 2015-04-04 DIAGNOSIS — E785 Hyperlipidemia, unspecified: Secondary | ICD-10-CM | POA: Diagnosis present

## 2015-04-04 LAB — INFLUENZA PANEL BY PCR (TYPE A & B)
H1N1 flu by pcr: NOT DETECTED
Influenza A By PCR: NEGATIVE
Influenza B By PCR: NEGATIVE

## 2015-04-04 LAB — PROTIME-INR
INR: 1.17 (ref 0.00–1.49)
Prothrombin Time: 15 seconds (ref 11.6–15.2)

## 2015-04-04 LAB — MRSA PCR SCREENING: MRSA by PCR: NEGATIVE

## 2015-04-04 LAB — GLUCOSE, CAPILLARY: GLUCOSE-CAPILLARY: 279 mg/dL — AB (ref 65–99)

## 2015-04-04 MED ORDER — BRIMONIDINE TARTRATE 0.2 % OP SOLN
1.0000 [drp] | Freq: Three times a day (TID) | OPHTHALMIC | Status: DC
  Administered 2015-04-04: 1 [drp] via OPHTHALMIC
  Filled 2015-04-04: qty 5

## 2015-04-04 MED ORDER — LEVOFLOXACIN 750 MG PO TABS: 750.0000 mg | ORAL_TABLET | Freq: Every day | ORAL | Status: DC

## 2015-04-04 MED ORDER — AMLODIPINE BESYLATE 10 MG PO TABS
10.0000 mg | ORAL_TABLET | Freq: Every day | ORAL | Status: DC
  Administered 2015-04-04: 10 mg via ORAL
  Filled 2015-04-04: qty 1

## 2015-04-04 MED ORDER — TRAVOPROST (BAK FREE) 0.004 % OP SOLN
1.0000 [drp] | Freq: Every day | OPHTHALMIC | Status: DC
  Filled 2015-04-04: qty 2.5

## 2015-04-04 MED ORDER — METHYLPREDNISOLONE SODIUM SUCC 125 MG IJ SOLR
60.0000 mg | Freq: Three times a day (TID) | INTRAMUSCULAR | Status: DC
  Administered 2015-04-04 (×2): 60 mg via INTRAVENOUS
  Filled 2015-04-04: qty 0.96
  Filled 2015-04-04: qty 2
  Filled 2015-04-04 (×2): qty 0.96

## 2015-04-04 MED ORDER — HEPARIN SODIUM (PORCINE) 5000 UNIT/ML IJ SOLN
5000.0000 [IU] | Freq: Three times a day (TID) | INTRAMUSCULAR | Status: DC
  Administered 2015-04-04: 5000 [IU] via SUBCUTANEOUS
  Filled 2015-04-04 (×4): qty 1

## 2015-04-04 MED ORDER — ALBUTEROL SULFATE (2.5 MG/3ML) 0.083% IN NEBU: 2.5000 mg | INHALATION_SOLUTION | RESPIRATORY_TRACT | Status: DC | PRN

## 2015-04-04 MED ORDER — ASPIRIN 81 MG PO CHEW
81.0000 mg | CHEWABLE_TABLET | Freq: Every day | ORAL | Status: DC
  Administered 2015-04-04: 81 mg via ORAL
  Filled 2015-04-04: qty 1

## 2015-04-04 MED ORDER — PREDNISONE 10 MG (21) PO TBPK: ORAL_TABLET | ORAL | Status: DC

## 2015-04-04 MED ORDER — LEVOFLOXACIN IN D5W 750 MG/150ML IV SOLN
750.0000 mg | INTRAVENOUS | Status: DC
  Administered 2015-04-04: 750 mg via INTRAVENOUS
  Filled 2015-04-04: qty 150

## 2015-04-04 MED ORDER — SODIUM CHLORIDE 0.9 % IV SOLN
INTRAVENOUS | Status: DC
  Administered 2015-04-04: 1000 mL via INTRAVENOUS

## 2015-04-04 MED ORDER — BRINZOLAMIDE 1 % OP SUSP
1.0000 [drp] | Freq: Three times a day (TID) | OPHTHALMIC | Status: DC
  Administered 2015-04-04: 1 [drp] via OPHTHALMIC
  Filled 2015-04-04: qty 10

## 2015-04-04 MED ORDER — LEVOFLOXACIN IN D5W 500 MG/100ML IV SOLN: 500.0000 mg | INTRAVENOUS | Status: DC

## 2015-04-04 MED ORDER — IPRATROPIUM-ALBUTEROL 0.5-2.5 (3) MG/3ML IN SOLN
3.0000 mL | RESPIRATORY_TRACT | Status: DC
  Administered 2015-04-04 (×2): 3 mL via RESPIRATORY_TRACT
  Filled 2015-04-04 (×2): qty 3

## 2015-04-04 MED ORDER — DM-GUAIFENESIN ER 30-600 MG PO TB12
1.0000 | ORAL_TABLET | Freq: Two times a day (BID) | ORAL | Status: DC
  Administered 2015-04-04: 1 via ORAL
  Filled 2015-04-04 (×2): qty 1

## 2015-04-04 MED ORDER — ROSUVASTATIN CALCIUM 10 MG PO TABS
10.0000 mg | ORAL_TABLET | Freq: Every day | ORAL | Status: DC
  Administered 2015-04-04: 10 mg via ORAL
  Filled 2015-04-04: qty 1

## 2015-04-04 MED ORDER — TAMSULOSIN HCL 0.4 MG PO CAPS
0.4000 mg | ORAL_CAPSULE | Freq: Every day | ORAL | Status: DC
  Filled 2015-04-04: qty 1

## 2015-04-04 NOTE — Progress Notes (Signed)
Utilization review complete. Aeralyn Barna RN CCM Case Mgmt phone [phone] 

## 2015-04-04 NOTE — ED Notes (Addendum)
Respiratory bedside to adjust bipap - placed on treatment mask.  Will continue to monitor.

## 2015-04-04 NOTE — Progress Notes (Addendum)
ANTIBIOTIC CONSULT NOTE - INITIAL  Pharmacy Consult for Levaquin  Indication: COP exacerbation  No Known Allergies  Patient Measurements: Height: 5\' 7"  (170.2 cm) Weight: 103 lb (46.72 kg) IBW/kg (Calculated) : 66.1  Vital Signs: Temp: 97.8 F (36.6 C) (06/21 2138) Temp Source: Axillary (06/21 2138) BP: 119/72 mmHg (06/22 0315) Pulse Rate: 94 (06/22 0315)  Labs:  Recent Labs  04/03/15 2137  WBC 5.6  HGB 15.0  PLT 128*  CREATININE 0.93   Estimated Creatinine Clearance: 32.8 mL/min (by C-G formula based on Cr of 0.93).  Medical History: Past Medical History  Diagnosis Date  . Hypertension   . Glaucoma   . Emphysema lung   . COPD (chronic obstructive pulmonary disease)   . CVA (cerebral infarction)   . BPH (benign prostatic hyperplasia)     Assessment: Levaquin for COPD exacerbation, WBC WNL, CrCl requiring Levaquin dose adjustment.   Plan:  -Levaquin 750 mg IV q48h -Trend WBC, temp, renal function   Abran Duke 04/04/2015,4:24 AM   Addendum -Levaquin dose adjusted to 500 mg IV q48h due to age and weight  Baldemar Friday  04/04/2015 9:18 AM

## 2015-04-04 NOTE — Progress Notes (Signed)
Discharge teaching and education discussed with pt and family. Pt sable and escorted to vehicle by NT and son. Lorin Picket, RN

## 2015-04-04 NOTE — Progress Notes (Signed)
Pt became agitated with BiPAP mask, RT took mask off pt and resumed continuous neb Tx. Pt tolerating well at this time.

## 2015-04-04 NOTE — Discharge Summary (Signed)
Physician Discharge Summary  Jesus Carey:[id number] DOB: 01-10-1920 DOA: 04/03/2015  PCP: Burtis Junes, MD  Admit date: 04/03/2015 Discharge date: 04/04/2015  Time spent: 25 minutes  Recommendations for Outpatient Follow-up:  1. Follow-up with primary care physician within one week.  Discharge Diagnoses:  Principal Problem:   COPD exacerbation Active Problems:   HLD (hyperlipidemia)   Essential hypertension   Stroke   BPH (benign prostatic hyperplasia)   Acute respiratory failure with hypercapnia   Discharge Condition: Stable  Diet recommendation: Heart healthy  Filed Weights   04/03/15 2138 04/04/15 0350  Weight: 46.72 kg (103 lb) 41 kg (90 lb 6.2 oz)    History of present illness:  Jesus Carey is a 81 y.o. male with PMH of COPD, emphysema, hypertension, hyperlipidemia, BPH, who presents with productive cough and shortness breast. He reports that he has a chronic cough which has been progressively getting worse since yesterday. He coughs up very thick white colored sputum. No chest pain, fever or chills. He also has worsening shortness of breath. No abdominal pain, diarrhea, symptoms of UTI. No leg edema, unilateral weakness. In ED, patient was found to have WBC 5.6, normal temperature, heart rate 75, electrolytes okay. ABG showed pH 7.355, PCO2 57.6, PO2 253. Negative chest x-ray. Patient is admitted to the patient for further evaluation and treatment.  Hospital Course:   Acute on chronic respiratory failure and COPD exacerbation:  Patient shortness of breath, productive cough and wheezing are consistent with COPD exacerbation. Chest x-ray has no pneumonia. Pulmonary embolism is less likely given no any chest pain. Patient has no history of CHF, no leg edema on admission.  -Admitted to stepdown -Started on bronchodilators, mucolytics, antitussives and oxygen as needed. -Started on oral levofloxacin and IV Solu-Medrol -Place on BiPAP when necessary, in  the ED, that was discontinued and stepdown. -When evaluating him in the morning he requested to be discharged, I spoke with his son Mr. Lourdes Sledge and he is okay with his discharge. -Discharged on levofloxacin for 5 more days and prednisone taper. Follow-up with primary care physician within one week.  HLD: Last LDL was 40 on 03/30/10 -Continue home medications: Crestor  BPH: stable - Continue Flomax  Essential hypertension: -continue amlodipine  Stroke: No acute heat use. -Aspirin   Procedures:  None  Consultations:  None  Discharge Exam: Filed Vitals:   04/04/15 1133  BP: 144/61  Pulse: 101  Temp: 97.5 F (36.4 C)  Resp: 23   General: Alert and awake, oriented x3, not in any acute distress. HEENT: anicteric sclera, pupils reactive to light and accommodation, EOMI CVS: S1-S2 clear, no murmur rubs or gallops Chest: Mild expiratory wheezing Abdomen: soft nontender, nondistended, normal bowel sounds, no organomegaly Extremities: no cyanosis, clubbing or edema noted bilaterally Neuro: Cranial nerves II-XII intact, no focal neurological deficits  Discharge Instructions   Discharge Instructions    Diet - low sodium heart healthy    Complete by:  As directed      Increase activity slowly    Complete by:  As directed           Current Discharge Medication List    START taking these medications   Details  levofloxacin (LEVAQUIN) 750 MG tablet Take 1 tablet (750 mg total) by mouth daily. Qty: 5 tablet, Refills: 0    predniSONE (STERAPRED UNI-PAK 21 TAB) 10 MG (21) TBPK tablet Take 6-5-4-3-2-1 tablet PO daily till gone Qty: 21 tablet, Refills: 0      CONTINUE these  medications which have NOT CHANGED   Details  albuterol (PROVENTIL HFA;VENTOLIN HFA) 108 (90 BASE) MCG/ACT inhaler Inhale 2 puffs into the lungs every 4 (four) hours as needed for wheezing or shortness of breath. Qty: 1 Inhaler, Refills: 2    amLODipine (NORVASC) 10 MG tablet Take 10 mg by mouth daily.     aspirin 81 MG tablet Take 81 mg by mouth daily.    brimonidine (ALPHAGAN) 0.2 % ophthalmic solution Place 1 drop into the right eye 3 (three) times daily.     brinzolamide (AZOPT) 1 % ophthalmic suspension Place 1 drop into both eyes 3 (three) times daily.     guaiFENesin (ROBITUSSIN) 100 MG/5ML liquid Take 5-10 mLs (100-200 mg total) by mouth 3 (three) times daily as needed for cough. Qty: 60 mL, Refills: 0    rosuvastatin (CRESTOR) 10 MG tablet Take 10 mg by mouth daily.    tamsulosin (FLOMAX) 0.4 MG CAPS capsule Take 1 capsule (0.4 mg total) by mouth at bedtime. Qty: 30 capsule, Refills: 0    travoprost, benzalkonium, (TRAVATAN) 0.004 % ophthalmic solution Place 1 drop into both eyes at bedtime.       STOP taking these medications     doxycycline (VIBRAMYCIN) 100 MG capsule      predniSONE (DELTASONE) 20 MG tablet        No Known Allergies Follow-up Information    Follow up with Burtis Junes, MD In 1 week.   Specialty:  Family Medicine   Contact information:   [address] [address] [place] [address] [phone]        The results of significant diagnostics from this hospitalization (including imaging, microbiology, ancillary and laboratory) are listed below for reference.    Significant Diagnostic Studies: Dg Chest 2 View (if Patient Has Fever And/or Copd)  03/15/2015   CLINICAL DATA:  Shortness of breath for 2 weeks.  EXAM: CHEST  2 VIEW  COMPARISON:  02/22/2015  FINDINGS: The lungs remain hyperinflated with probable emphysema. The heart size is normal, mediastinal contours are unchanged with mild tortuosity of the thoracic aorta. There is no pulmonary edema, consolidation, pleural effusion or pneumothorax. Presumed nipple shadow projects over the 5-6 interspace on the left, and is unchanged from prior exam. The bones are under mineralized, no acute osseous abnormality.  IMPRESSION: 1.  No acute pulmonary process. 2. Emphysema.   Electronically  Signed   By: Rubye Oaks M.D.   On: 03/15/2015 21:14   Dg Chest Portable 1 View  04/03/2015   CLINICAL DATA:  Increasing shortness of breath, respiratory distress  EXAM: PORTABLE CHEST - 1 VIEW  COMPARISON:  03/15/2015  FINDINGS: Chronic interstitial markings/ emphysematous changes. No focal consolidation. No pleural effusion or pneumothorax.  The heart is normal in size.  IMPRESSION: No evidence of acute cardiopulmonary disease.   Electronically Signed   By: Charline Bills M.D.   On: 04/03/2015 22:01    Microbiology: Recent Results (from the past 240 hour(s))  MRSA PCR Screening     Status: None   Collection Time: 04/04/15  3:55 AM  Result Value Ref Range Status   MRSA by PCR NEGATIVE NEGATIVE Final    Comment:        The GeneXpert MRSA Assay (FDA approved for NASAL specimens only), is one component of a comprehensive MRSA colonization surveillance program. It is not intended to diagnose MRSA infection nor to guide or monitor treatment for MRSA infections.      Labs: Basic Metabolic  Panel:  Recent Labs Lab 04/03/15 2137  NA 144  K 3.5  CL 104  CO2 28  GLUCOSE 115*  BUN 9  CREATININE 0.93  CALCIUM 9.4   Liver Function Tests: No results for input(s): AST, ALT, ALKPHOS, BILITOT, PROT, ALBUMIN in the last 168 hours. No results for input(s): LIPASE, AMYLASE in the last 168 hours. No results for input(s): AMMONIA in the last 168 hours. CBC:  Recent Labs Lab 04/03/15 2137  WBC 5.6  NEUTROABS 2.0  HGB 15.0  HCT 46.4  MCV 91.0  PLT 128*   Cardiac Enzymes: No results for input(s): CKTOTAL, CKMB, CKMBINDEX, TROPONINI in the last 168 hours. BNP: BNP (last 3 results)  Recent Labs  02/22/15 0446 03/15/15 2155  BNP 44.7 72.0    ProBNP (last 3 results)  Recent Labs  08/06/14 1849  PROBNP 197.7    CBG:  Recent Labs Lab 04/04/15 0556  GLUCAP 279*       Signed:  Faustina Gebert A  [facility] 04/04/2015, 11:43 AM

## 2015-04-04 NOTE — ED Notes (Signed)
Pt family informed RN that they wanted to leave b/c they were "tired of waiting for a bed."  RN contacted bed control who reported that the room was in the process of being cleaned.  Informed pt and family who agreed to stay.  Coffee given to family.

## 2015-04-04 NOTE — Progress Notes (Signed)
Inpatient Diabetes Program Recommendations  AACE/ADA: New Consensus Statement on Inpatient Glycemic Control (2013)  Target Ranges:  Prepandial:   less than 140 mg/dL      Peak postprandial:   less than 180 mg/dL (1-2 hours)      Critically ill patients:  140 - 180 mg/dL   Consider monitoring CBGs during steroid therapy.  Thank you  Piedad Climes BSN, RN,CDE Inpatient Diabetes Coordinator [phone] (team pager)

## 2015-04-09 LAB — CULTURE, BLOOD (ROUTINE X 2)
Culture: NO GROWTH
Culture: NO GROWTH

## 2015-04-26 ENCOUNTER — Telehealth: Payer: Self-pay | Admitting: Pulmonary Disease

## 2015-04-26 ENCOUNTER — Ambulatory Visit (INDEPENDENT_AMBULATORY_CARE_PROVIDER_SITE_OTHER): Payer: Medicare Other | Admitting: Pulmonary Disease

## 2015-04-26 ENCOUNTER — Encounter: Payer: Self-pay | Admitting: Pulmonary Disease

## 2015-04-26 VITALS — BP 140/78 | HR 70 | Temp 96.8°F | Ht 67.0 in | Wt 93.0 lb

## 2015-04-26 DIAGNOSIS — R0602 Shortness of breath: Secondary | ICD-10-CM | POA: Diagnosis not present

## 2015-04-26 DIAGNOSIS — J9602 Acute respiratory failure with hypercapnia: Secondary | ICD-10-CM

## 2015-04-26 DIAGNOSIS — J441 Chronic obstructive pulmonary disease with (acute) exacerbation: Secondary | ICD-10-CM | POA: Diagnosis not present

## 2015-04-26 MED ORDER — IPRATROPIUM-ALBUTEROL 0.5-2.5 (3) MG/3ML IN SOLN: 3.0000 mL | Freq: Four times a day (QID) | RESPIRATORY_TRACT | Status: DC

## 2015-04-26 MED ORDER — PREDNISONE 20 MG PO TABS: 20.0000 mg | ORAL_TABLET | Freq: Every day | ORAL | Status: DC

## 2015-04-26 MED ORDER — LEVALBUTEROL HCL 0.63 MG/3ML IN NEBU
0.6300 mg | INHALATION_SOLUTION | Freq: Once | RESPIRATORY_TRACT | Status: AC
  Administered 2015-04-26: 0.63 mg via RESPIRATORY_TRACT

## 2015-04-26 NOTE — Patient Instructions (Signed)
Mr. Jesus Carey- it was a pleasure meeting you today...   We reviewed your recent ER records and the note from DrBlount>  We decided to get you a NEBULIZER machine to take breathing treatments at home 4 times daily...    You will need to have your family help you set up the machine for these treatments at breakfast, lunch, dinner, & bedtime...  We are also starting you on a cortisone anti-inflammatory medication> PREDNISONE 20mg  take one tab every morning...  You should also get the OTC MUCINEX 600mg  tabs- one tab 4 times daily w/ extra water by mouth...  Finally we are going to start you on some OXYGEN at home- 1L/min at rest & 2L/min w/ exercise...  Let's plan a follow up office visit in 1 month, call sooner if needed for problems.Marland Kitchen..Marland Kitchen

## 2015-04-26 NOTE — Telephone Encounter (Signed)
Spoke with Lateef. States that [facility] needs the dx code on the prescription for Duoneb. This has been done and resent to [facility]. Nothing further was needed at this time.

## 2015-04-26 NOTE — Progress Notes (Signed)
Subjective:     Patient ID: Jesus Carey, male   DOB: [birth year], 81 y.o.   MRN: [id number]  HPI 81 y/o gentleman, referred by DrABlount for a pulmonary evaluation>>  He is here w/ his son, he is elderly/ frail/ chronically ill appearing; son indicates that they are here to get oxygen and a nebulizer for his Dad having been referred by DrBlount for us to get these for him...  Patient is c/o cough for yrs, heavy mucus/ thick phlegm & "a blockage" in his upper chest that makes it difficult to expectorate the phlegm; he denies hemoptysis; notes chronic SOB/ DOE w/ min activity including ADLs for as long as he can remember, no CP, no f/c/s... He is an ex-smoker having smoked for 50+yrs but only up to 1/2ppd he says and quit around age 81 (20+yrs ago);  He denies any hx of lung diseases- states w/o bronchitis, pneumonia, Tb or exposure, asthma, etc... They do not recall if he has ever had vaccinations for the flu, Pneumovax, etc...     He went to the ER 04/03/15 & [facility]>  Dx w/ a COPD exacerbation & son says that's the 1st they heard about COPD; treated w/ Levaquin, Solumedrol=> Pred, Oxygen, Nebulizer treatments which helped...    Review in EPIC shows 8 recurrent ER visits over the last 2 yrs... Office visit from DrBlount dated 04/10/15 is reviewed> on Symbicort 2sp daily, Ventolin 2sp every 4H, off antibiotics; diagnosis was COPD exac w/ wheezing, no change in therapy...   EXAM shows Afeb, VSS, O2sat=93% on RA;  HEENT- edent, mallampati1, weak voice;  Chest- decr BS at bases w/ scat bilat rhonchi & end-exp wheezing, no rales, no consolid;  Heart- RR gr1/6 SEM w/o r/g;  Abd- soft, neg;  Ext- w/o c/c/e...   CT Angio Chest 03/2010 showed neg for PE, ectatic asc Aorta ~4cm, coronary art calcif, COPD, mild atx 7 pleural thickening  ABGs on O2 in ER 04/03/15 showed pH=7.36, pCO2=53.6, pO2=253  CXR 03/2015 showed norm heart size, Ao elong & calcif, COPD/emphysema w/ incr interstitial markings,  NAD.Marland Kitchen.Marland Kitchen.  EKG 04/03/15 showed NSR, rate77, PVCs noted, NSSTTWA...  LABS 6/16 in hosp showed> [place]ms- ok x BS=115-170, Cr=0.9-1.1;  CBC- wnl;  BNP=72  Ambulatory Oxygen Saturation test 04/26/15>  O2sat=97% on RA at rest w/ pulse=82/min;  He ambulated 1 lap in office w/ nadir O2sat=88% w/ HR=92/min...   IMP/PLAN>>  Chrissie Carey is 81 y/o and chronically ill appearing, very frail/ weak, with multisystem disease;  He has severe COPD/ emphysema and was unable to cooperate sufficiently to get meaningful numbers from a PFT;  He has evid of [facility] ABGs [facility];  He qualifies for Home O2- 1L/min at rest & 2L/min w/ exercise/activity;  We will also arrange for a home NEBULIZER w/ DUONEB Qid;  Finally we decided to give him a trial of PREDNISONE 20mg  daily (Qam) over the next month and OTC MUCINEX 600mg  QID w/ fluids to help w/ the thick phlegm... I have asked him to return in 37mo for reassessment.    Past Medical History  Diagnosis Date  . Hypertension >> on Amlod10    Hyperlipidemia >> on Crestor10     ASPVD w/ dilated aneurysmal aorta on ASA81   . Glaucoma >> on 3 eye drops per ophthalmology   . Emphysema lung >> on Symbicort, Ventolin   . COPD (chronic obstructive pulmonary disease) w/ hypercarbic resp failure   . CVA (cerebral infarction) >>  on ASA81    BPH >> on Flomax0.4     No past surgical history on file.   Outpatient Encounter Prescriptions as of 04/26/2015  Medication Sig  . albuterol (PROVENTIL HFA;VENTOLIN HFA) 108 (90 BASE) MCG/ACT inhaler Inhale 2 puffs into the lungs every 4 (four) hours as needed for wheezing or shortness of breath.  Marland Kitchen amLODipine (NORVASC) 10 MG tablet Take 10 mg by mouth daily.  Marland Kitchen aspirin 81 MG tablet Take 81 mg by mouth daily.  . brimonidine (ALPHAGAN) 0.2 % ophthalmic solution Place 1 drop into the right eye 3 (three) times daily.   . brinzolamide (AZOPT) 1 % ophthalmic suspension Place 1 drop into both eyes 3 (three) times daily.   .  rosuvastatin (CRESTOR) 10 MG tablet Take 10 mg by mouth daily.  . travoprost, benzalkonium, (TRAVATAN) 0.004 % ophthalmic solution Place 1 drop into both eyes at bedtime.   Marland Kitchen guaiFENesin (ROBITUSSIN) 100 MG/5ML liquid Take 5-10 mLs (100-200 mg total) by mouth 3 (three) times daily as needed for cough. (Patient not taking: Reported on 04/26/2015)  . levofloxacin (LEVAQUIN) 750 MG tablet Take 1 tablet (750 mg total) by mouth daily. (Patient not taking: Reported on 04/26/2015)  . predniSONE (STERAPRED UNI-PAK 21 TAB) 10 MG (21) TBPK tablet Take 6-5-4-3-2-1 tablet PO daily till gone (Patient not taking: Reported on 04/26/2015)  . tamsulosin (FLOMAX) 0.4 MG CAPS capsule Take 1 capsule (0.4 mg total) by mouth at bedtime. (Patient not taking: Reported on 04/26/2015)    No Known Allergies   No family history on file.   History   Social History  . Marital Status: Widowed    Spouse Name: N/A  . Number of Children: N/A  . Years of Education: N/A   Occupational History  . Not on file.   Social History Main Topics  . Smoking status: Former Smoker -- 0.50 packs/day for 20 years    Types: Cigarettes    Quit date: 04/26/1995  . Smokeless tobacco: Not on file  . Alcohol Use: No  . Drug Use: No  . Sexual Activity: Not Currently   Other Topics Concern  . Not on file   Social History Narrative    Current Medications, Allergies, Past Medical History, Past Surgical History, Family History, and Social History were reviewed in [employer] record.   Review of Systems            All symptoms NEG except where BOLDED >>  Constitutional:  F/C/S, fatigue, anorexia, unexpected weight change. HEENT:  HA, visual changes, hearing loss, earache, nasal symptoms, sore throat, mouth sores, hoarseness. Resp:  cough, sputum, hemoptysis; SOB, tightness, wheezing. Cardio:  CP, palpit, DOE, orthopnea, edema. GI:  N/V/D/C, blood in stool; reflux, abd pain, distention, gas. GU:  dysuria,  freq, urgency, hematuria, flank pain, voiding difficulty. MS:  joint pain, swelling, tenderness, decr ROM; neck pain, back pain, etc. Neuro:  HA, tremors, seizures, dizziness, syncope, weakness, numbness, gait abn. Skin:  suspicious lesions or skin rash. Heme:  adenopathy, bruising, bleeding. Psyche:  confusion, agitation, sleep disturbance, hallucinations, anxiety, depression suicidal.   Objective:   Physical Exam      Vital Signs:  Reviewed...  General:  WD, thin, 81 y/o BM chronically ill appearing; alert, pleasant & cooperative... HEENT:  [place]/AT; Conjunctiva- pink, Sclera- nonicteric, EOM-wnl, PERRLA, EACs-clear, TMs-wnl; NOSE-clear; THROAT-clear & wnl. Neck:  Supple w/ fair ROM; no JVD; normal carotid impulses w/o bruits; no thyromegaly or nodules palpated; no lymphadenopathy. Chest:  decr BS, mild  end-exp wheezing & scat rhonchi, no rales or signs of consolidation... Heart:  Regular Rhythm; gr 1/6 SEM w/o rubs or gallops detected. Abdomen:  Soft & nontender- no guarding or rebound; normal bowel sounds; no organomegaly or masses palpated. Ext:  decrROM; +deformities/ arthritic changes; no varicose veins, +enous insuffic, no edema;  Pulses intact w/o bruits. Neuro:  No focal neuro deficits; abn gait & balance... Derm:  No lesions noted; no rash etc. Lymph:  No cervical, supraclavicular, axillary, or inguinal adenopathy palpated.   Assessment:      IMP >>  81 y/o, markedly debilitated, malnourished, adult FTT >> Severe COPD/ emphysema >> Hx hypercarbic & hypoxemic respiratory failure >> HBP Atherosclerotic calcif in coronary arteries seen Jun2011 CT Chest Atherosclerotic peripheral vascular dis w/ dilated (4cm) tortuous Ao seen on prev films Hypercholesterolemia BPH w/ BOO Glaucoma  PLAN >>  Jersey is 81 y/o and chronically ill appearing, very frail/ weak, with multisystem disease;  He has severe COPD/ emphysema and was unable to cooperate sufficiently to get meaningful  numbers from a PFT;  He has evid of CO2 retention on ABGs during recent [facility];  He qualifies for Home O2- 1L/min at rest & 2L/min w/ exercise/activity;  We will also arrange for a home NEBULIZER w/ DUONEB Qid;  Finally we decided to give him a trial of PREDNISONE  daily (Qam) over the next month and OTC MUCINEX  QID w/ fluids to help w/ the thick phlegm... I have asked him to return in 23mo for reassessment.     Plan:     Patient's Medications  New Prescriptions   IPRATROPIUM-ALBUTEROL (DUONEB) 0.5-2.5 (3) MG/3ML SOLN    Take 3 mLs by nebulization 4 (four) times daily.   PREDNISONE (DELTASONE) 20 MG TABLET    Take 1 tablet (20 mg total) by mouth daily with breakfast.  Previous Medications   ALBUTEROL (PROVENTIL HFA;VENTOLIN HFA) 108 (90 BASE) MCG/ACT INHALER    Inhale 2 puffs into the lungs every 4 (four) hours as needed for wheezing or shortness of breath.   AMLODIPINE (NORVASC) 10 MG TABLET    Take 10 mg by mouth daily.   ASPIRIN 81 MG TABLET    Take 81 mg by mouth daily.   BRIMONIDINE (ALPHAGAN) 0.2 % OPHTHALMIC SOLUTION    Place 1 drop into the right eye 3 (three) times daily.    BRINZOLAMIDE (AZOPT) 1 % OPHTHALMIC SUSPENSION    Place 1 drop into both eyes 3 (three) times daily.    GUAIFENESIN (ROBITUSSIN) 100 MG/5ML LIQUID    Take 5-10 mLs (100-200 mg total) by mouth 3 (three) times daily as needed for cough.   LEVOFLOXACIN (LEVAQUIN) 750 MG TABLET    Take 1 tablet (750 mg total) by mouth daily.   PREDNISONE (STERAPRED UNI-PAK 21 TAB) 10 MG (21) TBPK TABLET    Take 6-5-4-3-2-1 tablet PO daily till gone   ROSUVASTATIN (CRESTOR) 10 MG TABLET    Take 10 mg by mouth daily.   TAMSULOSIN (FLOMAX) 0.4 MG CAPS CAPSULE    Take 1 capsule (0.4 mg total) by mouth at bedtime.   TRAVOPROST, BENZALKONIUM, (TRAVATAN) 0.004 % OPHTHALMIC SOLUTION    Place 1 drop into both eyes at bedtime.   Modified Medications   No medications on file  Discontinued Medications   No medications on file

## 2015-04-27 ENCOUNTER — Telehealth: Payer: Self-pay | Admitting: Pulmonary Disease

## 2015-04-27 NOTE — Telephone Encounter (Signed)
Form signed by SN and faxed   Nothing further is needed  

## 2015-04-27 NOTE — Telephone Encounter (Signed)
Pt (EC)Jesus Carey, aware that we will contact her once we know what is going on with nebulizer meds. States that they need this ASAP.  Called [facility] - Pharmacist [address]([place]andler) requires for certain pulmonary meds that a Medicare Part B detailed written form be filled out before processing Rx. Please be on the look out [employer] for this form so that it may be sent back ASAP to pharmacy.

## 2015-04-27 NOTE — Telephone Encounter (Signed)
Called [facility] and confirmed they did received form back they needed to fill pt neb medication. They will get this ready for pt Called daughter and made her aware of this. Nothing further needed

## 2015-05-08 ENCOUNTER — Telehealth: Payer: Self-pay | Admitting: Pulmonary Disease

## 2015-05-08 NOTE — Telephone Encounter (Signed)
Lateef states she spoke with Ashtyn but recd a missed call after this.  Wanted to see what we were calling back for???  She may be reached at [phone].

## 2015-05-08 NOTE — Telephone Encounter (Signed)
Per SN: Continue Pred  qd dose Okay to decrease Duoneb to 1/2 vial in machine TID and see if he tolerates this better -- let us know how he tolerates In addition to Tamsulosin he is already taking for urination (1 ), he can try OTC Miralax and/or Senokot-S for bowels/constipation.

## 2015-05-08 NOTE — Telephone Encounter (Signed)
Spoke with Jesus Carey, aware of recs per SN States that the patient is no longer taking the Tamsulosin - this was completed and never refilled.  Patient has been using Magnesium Citrate liquid and this quit helping to move his bowels once he started the nebs.  Pt daughter states that he has been having back pains - feels may be d/t urination and bowel issues. Back pains started when it became difficult to urinate and defecate.  Not sure if he needs to be taken to Urgent Care or ED to be evaluated.   Jesus Carey is asking that SN refill the Flomax - Please advise if okay to refill Tamsulosin 0.4mg  - given last by MD @  [facility]   Please advise SN. Thanks.

## 2015-05-08 NOTE — Telephone Encounter (Signed)
Per SN: pt needs to follow up with PCP about this issue.  States that he can hold off of neb meds until seeing PCP and restart at a lower dose after seeing PCP. Spoke with Lateef, aware of SN's recs.  Nothing further needed.

## 2015-05-08 NOTE — Telephone Encounter (Signed)
Spoke with Cherylann Ratel - patient is having side effects from Ipratropium nebulizer and Prednisone Pt unable to urinate and defecate since starting nebulizer. Pt has taken Prednisone alone about 2 months ago with no problem with his urination and bowels.  Daughter feels that the Ipratropium is causing these issues or the combination of the two.  Pt using Ipratropium QID, requests to decrease down to 2-3 times daily or decrease the Prednisone while on Ipratropium  Patient is currently on Prednisone  qd.   Pt daughter asking also that the instructions be adjusted per his symptoms on a day to day basis. Pt is supposed to be taking nebulizer QID but they are wanting to know if he can take it PRN if symptoms are not present. Feels that he does not need the medication everyday QID. Please advise Dr Kriste Basque. Thanks.   No Known Allergies   Medication List       This list is accurate as of: 05/08/15  2:27 PM.  Always use your most recent med list.               albuterol 108 (90 BASE) MCG/ACT inhaler  Commonly known as:  PROVENTIL HFA;VENTOLIN HFA  Inhale 2 puffs into the lungs every 4 (four) hours as needed for wheezing or shortness of breath.     amLODipine 10 MG tablet  Commonly known as:  NORVASC  Take 10 mg by mouth daily.     aspirin 81 MG tablet  Take 81 mg by mouth daily.     brimonidine 0.2 % ophthalmic solution  Commonly known as:  ALPHAGAN  Place 1 drop into the right eye 3 (three) times daily.     brinzolamide 1 % ophthalmic suspension  Commonly known as:  AZOPT  Place 1 drop into both eyes 3 (three) times daily.     guaiFENesin 100 MG/5ML liquid  Commonly known as:  ROBITUSSIN  Take 5-10 mLs (100-200 mg total) by mouth 3 (three) times daily as needed for cough.     ipratropium-albuterol 0.5-2.5 (3) MG/3ML Soln  Commonly known as:  DUONEB  Take 3 mLs by nebulization 4 (four) times daily.     levofloxacin 750 MG tablet  Commonly known as:  LEVAQUIN  Take 1 tablet (750 mg  total) by mouth daily.     predniSONE 10 MG (21) Tbpk tablet  Commonly known as:  STERAPRED UNI-PAK 21 TAB  Take 6-5-4-3-2-1 tablet PO daily till gone     predniSONE 20 MG tablet  Commonly known as:  DELTASONE  Take 1 tablet (20 mg total) by mouth daily with breakfast.     rosuvastatin 10 MG tablet  Commonly known as:  CRESTOR  Take 10 mg by mouth daily.     tamsulosin 0.4 MG Caps capsule  Commonly known as:  FLOMAX  Take 1 capsule (0.4 mg total) by mouth at bedtime.     travoprost (benzalkonium) 0.004 % ophthalmic solution  Commonly known as:  TRAVATAN  Place 1 drop into both eyes at bedtime.

## 2015-05-08 NOTE — Telephone Encounter (Signed)
LM for [employer]

## 2015-05-10 ENCOUNTER — Encounter (HOSPITAL_COMMUNITY): Payer: Self-pay | Admitting: *Deleted

## 2015-05-10 ENCOUNTER — Emergency Department (HOSPITAL_COMMUNITY): Payer: Medicare Other

## 2015-05-10 ENCOUNTER — Emergency Department (HOSPITAL_COMMUNITY)
Admission: EM | Admit: 2015-05-10 | Discharge: 2015-05-11 | Disposition: A | Discharge status: Home / Self Care | Payer: Medicare Other | Source: Home / Self Care | Attending: Emergency Medicine | Admitting: Emergency Medicine

## 2015-05-10 DIAGNOSIS — Z7952 Long term (current) use of systemic steroids: Secondary | ICD-10-CM | POA: Insufficient documentation

## 2015-05-10 DIAGNOSIS — H409 Unspecified glaucoma: Secondary | ICD-10-CM

## 2015-05-10 DIAGNOSIS — Z7982 Long term (current) use of aspirin: Secondary | ICD-10-CM | POA: Insufficient documentation

## 2015-05-10 DIAGNOSIS — N179 Acute kidney failure, unspecified: Secondary | ICD-10-CM | POA: Diagnosis not present

## 2015-05-10 DIAGNOSIS — R319 Hematuria, unspecified: Secondary | ICD-10-CM | POA: Diagnosis present

## 2015-05-10 DIAGNOSIS — I10 Essential (primary) hypertension: Secondary | ICD-10-CM | POA: Insufficient documentation

## 2015-05-10 DIAGNOSIS — K5900 Constipation, unspecified: Secondary | ICD-10-CM

## 2015-05-10 DIAGNOSIS — J441 Chronic obstructive pulmonary disease with (acute) exacerbation: Secondary | ICD-10-CM

## 2015-05-10 DIAGNOSIS — N400 Enlarged prostate without lower urinary tract symptoms: Secondary | ICD-10-CM | POA: Insufficient documentation

## 2015-05-10 DIAGNOSIS — Z8673 Personal history of transient ischemic attack (TIA), and cerebral infarction without residual deficits: Secondary | ICD-10-CM

## 2015-05-10 DIAGNOSIS — Z79899 Other long term (current) drug therapy: Secondary | ICD-10-CM | POA: Diagnosis not present

## 2015-05-10 DIAGNOSIS — Z87891 Personal history of nicotine dependence: Secondary | ICD-10-CM

## 2015-05-10 DIAGNOSIS — R05 Cough: Secondary | ICD-10-CM | POA: Diagnosis not present

## 2015-05-10 DIAGNOSIS — R338 Other retention of urine: Secondary | ICD-10-CM

## 2015-05-10 DIAGNOSIS — R339 Retention of urine, unspecified: Secondary | ICD-10-CM | POA: Diagnosis not present

## 2015-05-10 DIAGNOSIS — J449 Chronic obstructive pulmonary disease, unspecified: Secondary | ICD-10-CM | POA: Diagnosis not present

## 2015-05-10 MED ORDER — ALBUTEROL SULFATE (2.5 MG/3ML) 0.083% IN NEBU
5.0000 mg | INHALATION_SOLUTION | Freq: Once | RESPIRATORY_TRACT | Status: AC
  Administered 2015-05-10: 5 mg via RESPIRATORY_TRACT
  Filled 2015-05-10: qty 6

## 2015-05-10 NOTE — ED Notes (Signed)
Pt's son reports pt has been on prednisone and albuterol txs for his COPD for 3 days and started to have swelling in his R side and "hardening of his stomach."  Pt reports LBM x 3 days ago.  Son also reports urinary retention.

## 2015-05-10 NOTE — ED Notes (Signed)
Patient transported to X-ray 

## 2015-05-10 NOTE — ED Notes (Signed)
Bed: WA21 Expected date:  Expected time:  Means of arrival:  Comments: Hold for TR1

## 2015-05-11 ENCOUNTER — Observation Stay (HOSPITAL_COMMUNITY): Payer: Medicare Other

## 2015-05-11 ENCOUNTER — Encounter (HOSPITAL_COMMUNITY): Payer: Self-pay | Admitting: Emergency Medicine

## 2015-05-11 ENCOUNTER — Observation Stay (HOSPITAL_COMMUNITY)
Admission: EM | Admit: 2015-05-11 | Discharge: 2015-05-12 | Disposition: A | Discharge status: Home / Self Care | Payer: Medicare Other | Attending: Internal Medicine | Admitting: Internal Medicine

## 2015-05-11 DIAGNOSIS — R319 Hematuria, unspecified: Secondary | ICD-10-CM | POA: Diagnosis not present

## 2015-05-11 DIAGNOSIS — J449 Chronic obstructive pulmonary disease, unspecified: Secondary | ICD-10-CM | POA: Diagnosis present

## 2015-05-11 DIAGNOSIS — K5900 Constipation, unspecified: Secondary | ICD-10-CM | POA: Diagnosis present

## 2015-05-11 DIAGNOSIS — N179 Acute kidney failure, unspecified: Secondary | ICD-10-CM | POA: Diagnosis present

## 2015-05-11 DIAGNOSIS — Z8673 Personal history of transient ischemic attack (TIA), and cerebral infarction without residual deficits: Secondary | ICD-10-CM | POA: Insufficient documentation

## 2015-05-11 DIAGNOSIS — R05 Cough: Secondary | ICD-10-CM | POA: Insufficient documentation

## 2015-05-11 DIAGNOSIS — Z7982 Long term (current) use of aspirin: Secondary | ICD-10-CM | POA: Insufficient documentation

## 2015-05-11 DIAGNOSIS — R339 Retention of urine, unspecified: Principal | ICD-10-CM | POA: Insufficient documentation

## 2015-05-11 DIAGNOSIS — I10 Essential (primary) hypertension: Secondary | ICD-10-CM | POA: Diagnosis present

## 2015-05-11 DIAGNOSIS — R338 Other retention of urine: Secondary | ICD-10-CM | POA: Diagnosis present

## 2015-05-11 DIAGNOSIS — N400 Enlarged prostate without lower urinary tract symptoms: Secondary | ICD-10-CM | POA: Diagnosis present

## 2015-05-11 DIAGNOSIS — Z87891 Personal history of nicotine dependence: Secondary | ICD-10-CM | POA: Insufficient documentation

## 2015-05-11 DIAGNOSIS — Z79899 Other long term (current) drug therapy: Secondary | ICD-10-CM | POA: Insufficient documentation

## 2015-05-11 DIAGNOSIS — H409 Unspecified glaucoma: Secondary | ICD-10-CM | POA: Insufficient documentation

## 2015-05-11 DIAGNOSIS — E785 Hyperlipidemia, unspecified: Secondary | ICD-10-CM | POA: Diagnosis present

## 2015-05-11 LAB — CBC WITH DIFFERENTIAL/PLATELET
BASOS PCT: 0 % (ref 0–1)
Basophils Absolute: 0 10*3/uL (ref 0.0–0.1)
EOS ABS: 0 10*3/uL (ref 0.0–0.7)
Eosinophils Relative: 0 % (ref 0–5)
HEMATOCRIT: 44.2 % (ref 39.0–52.0)
Hemoglobin: 14.7 g/dL (ref 13.0–17.0)
LYMPHS ABS: 0.6 10*3/uL — AB (ref 0.7–4.0)
Lymphocytes Relative: 5 % — ABNORMAL LOW (ref 12–46)
MCH: 29.2 pg (ref 26.0–34.0)
MCHC: 33.3 g/dL (ref 30.0–36.0)
MCV: 87.9 fL (ref 78.0–100.0)
Monocytes Absolute: 1.4 10*3/uL — ABNORMAL HIGH (ref 0.1–1.0)
Monocytes Relative: 10 % (ref 3–12)
Neutro Abs: 12 10*3/uL — ABNORMAL HIGH (ref 1.7–7.7)
Neutrophils Relative %: 85 % — ABNORMAL HIGH (ref 43–77)
Platelets: 135 10*3/uL — ABNORMAL LOW (ref 150–400)
RBC: 5.03 MIL/uL (ref 4.22–5.81)
RDW: 14.6 % (ref 11.5–15.5)
WBC: 14.1 10*3/uL — ABNORMAL HIGH (ref 4.0–10.5)

## 2015-05-11 LAB — URINE MICROSCOPIC-ADD ON

## 2015-05-11 LAB — BASIC METABOLIC PANEL
Anion gap: 11 (ref 5–15)
BUN: 62 mg/dL — ABNORMAL HIGH (ref 6–20)
CO2: 26 mmol/L (ref 22–32)
Calcium: 8.6 mg/dL — ABNORMAL LOW (ref 8.9–10.3)
Chloride: 100 mmol/L — ABNORMAL LOW (ref 101–111)
Creatinine, Ser: 1.93 mg/dL — ABNORMAL HIGH (ref 0.61–1.24)
GFR calc Af Amer: 33 mL/min — ABNORMAL LOW (ref >60)
GFR, EST NON AFRICAN AMERICAN: 28 mL/min — AB (ref >60)
GLUCOSE: 106 mg/dL — AB (ref 65–99)
POTASSIUM: 4.3 mmol/L (ref 3.5–5.1)
Sodium: 137 mmol/L (ref 135–145)

## 2015-05-11 LAB — URINALYSIS, ROUTINE W REFLEX MICROSCOPIC
BILIRUBIN URINE: NEGATIVE
GLUCOSE, UA: NEGATIVE mg/dL
KETONES UR: NEGATIVE mg/dL
Nitrite: NEGATIVE
Protein, ur: NEGATIVE mg/dL
SPECIFIC GRAVITY, URINE: 1.013 (ref 1.005–1.030)
Urobilinogen, UA: 0.2 mg/dL (ref 0.0–1.0)
pH: 7 (ref 5.0–8.0)

## 2015-05-11 MED ORDER — ACETAMINOPHEN 325 MG PO TABS: 650.0000 mg | ORAL_TABLET | Freq: Four times a day (QID) | ORAL | Status: DC | PRN

## 2015-05-11 MED ORDER — LIDOCAINE HCL (CARDIAC) 20 MG/ML IV SOLN
INTRAVENOUS | Status: AC
  Filled 2015-05-11: qty 5

## 2015-05-11 MED ORDER — SODIUM CHLORIDE 0.9 % IV BOLUS (SEPSIS)
1000.0000 mL | Freq: Once | INTRAVENOUS | Status: AC
  Administered 2015-05-11: 1000 mL via INTRAVENOUS

## 2015-05-11 MED ORDER — SODIUM CHLORIDE 0.9 % IV SOLN
INTRAVENOUS | Status: DC
Start: 2015-05-11 — End: 2015-05-12
  Administered 2015-05-11: 15:00:00 via INTRAVENOUS

## 2015-05-11 MED ORDER — AZITHROMYCIN 250 MG PO TABS
500.0000 mg | ORAL_TABLET | Freq: Once | ORAL | Status: AC
  Administered 2015-05-11: 500 mg via ORAL
  Filled 2015-05-11: qty 2

## 2015-05-11 MED ORDER — IPRATROPIUM-ALBUTEROL 0.5-2.5 (3) MG/3ML IN SOLN: 3.0000 mL | Freq: Four times a day (QID) | RESPIRATORY_TRACT | Status: DC

## 2015-05-11 MED ORDER — POLYETHYLENE GLYCOL 3350 17 G PO PACK
17.0000 g | PACK | Freq: Two times a day (BID) | ORAL | Status: DC
  Administered 2015-05-11 – 2015-05-12 (×2): 17 g via ORAL
  Filled 2015-05-11 (×3): qty 1

## 2015-05-11 MED ORDER — SENNA 8.6 MG PO TABS
1.0000 | ORAL_TABLET | Freq: Two times a day (BID) | ORAL | Status: DC
  Administered 2015-05-11 – 2015-05-12 (×2): 8.6 mg via ORAL
  Filled 2015-05-11 (×2): qty 1

## 2015-05-11 MED ORDER — AMLODIPINE BESYLATE 10 MG PO TABS
10.0000 mg | ORAL_TABLET | Freq: Every day | ORAL | Status: DC
  Administered 2015-05-12: 10 mg via ORAL
  Filled 2015-05-11 (×2): qty 1

## 2015-05-11 MED ORDER — LATANOPROST 0.005 % OP SOLN
1.0000 [drp] | Freq: Every day | OPHTHALMIC | Status: DC
  Administered 2015-05-11: 1 [drp] via OPHTHALMIC
  Filled 2015-05-11: qty 2.5

## 2015-05-11 MED ORDER — ASPIRIN EC 81 MG PO TBEC
81.0000 mg | DELAYED_RELEASE_TABLET | Freq: Every day | ORAL | Status: DC
  Administered 2015-05-12: 81 mg via ORAL
  Filled 2015-05-11 (×2): qty 1

## 2015-05-11 MED ORDER — AZITHROMYCIN 250 MG PO TABS: ORAL_TABLET | ORAL | Status: DC

## 2015-05-11 MED ORDER — ROSUVASTATIN CALCIUM 10 MG PO TABS
10.0000 mg | ORAL_TABLET | Freq: Every day | ORAL | Status: DC
  Administered 2015-05-12: 10 mg via ORAL
  Filled 2015-05-11 (×2): qty 1

## 2015-05-11 MED ORDER — LIDOCAINE HCL 2 % EX GEL
CUTANEOUS | Status: AC
  Filled 2015-05-11: qty 10

## 2015-05-11 MED ORDER — ALBUTEROL SULFATE (2.5 MG/3ML) 0.083% IN NEBU: 2.5000 mg | INHALATION_SOLUTION | RESPIRATORY_TRACT | Status: DC | PRN

## 2015-05-11 MED ORDER — TAMSULOSIN HCL 0.4 MG PO CAPS
0.4000 mg | ORAL_CAPSULE | Freq: Every day | ORAL | Status: DC
  Administered 2015-05-11 – 2015-05-12 (×2): 0.4 mg via ORAL
  Filled 2015-05-11 (×2): qty 1

## 2015-05-11 MED ORDER — IPRATROPIUM-ALBUTEROL 0.5-2.5 (3) MG/3ML IN SOLN
3.0000 mL | Freq: Four times a day (QID) | RESPIRATORY_TRACT | Status: DC
  Administered 2015-05-11 – 2015-05-12 (×3): 3 mL via RESPIRATORY_TRACT
  Filled 2015-05-11 (×4): qty 3

## 2015-05-11 MED ORDER — BRINZOLAMIDE 1 % OP SUSP
1.0000 [drp] | Freq: Three times a day (TID) | OPHTHALMIC | Status: DC
  Administered 2015-05-11 – 2015-05-12 (×3): 1 [drp] via OPHTHALMIC
  Filled 2015-05-11: qty 10

## 2015-05-11 MED ORDER — PREDNISONE 10 MG PO TABS
10.0000 mg | ORAL_TABLET | Freq: Every day | ORAL | Status: DC
  Administered 2015-05-12: 10 mg via ORAL
  Filled 2015-05-11 (×3): qty 1

## 2015-05-11 MED ORDER — ACETAMINOPHEN 650 MG RE SUPP: 650.0000 mg | Freq: Four times a day (QID) | RECTAL | Status: DC | PRN

## 2015-05-11 MED ORDER — BRIMONIDINE TARTRATE 0.2 % OP SOLN
1.0000 [drp] | Freq: Three times a day (TID) | OPHTHALMIC | Status: DC
  Administered 2015-05-11 – 2015-05-12 (×3): 1 [drp] via OPHTHALMIC
  Filled 2015-05-11: qty 5

## 2015-05-11 NOTE — ED Notes (Signed)
Pt had foley catheter placed yesterday. Pt began to notice blood in urine upon awakening this am. No pain.

## 2015-05-11 NOTE — Discharge Instructions (Signed)
Take the antibiotic until gone. Get miralax and put one dose or 17 g in 8 ounces of water,  take 1 dose every 30 minutes for 2-3 hours or until you  get good results and then once or twice daily to prevent constipation. Call Dr Margrett Rud office to get an appointment about your difficulty urinating. The urine bag will need to be drained several times a day. Return to the ED if you feel worse such as fever, struggling to breathe, or the catheter not draining urine.

## 2015-05-11 NOTE — ED Notes (Addendum)
Irrigated foley. Foley draining clear red urine prior to irrigation. Irrigated foley with a total of NS until clear. No clots noted.

## 2015-05-11 NOTE — ED Provider Notes (Signed)
CSN: [id number]     Arrival date & time 05/11/15  7829 History   First MD Initiated Contact with Patient 05/11/15 [phone]     Chief Complaint  Patient presents with  . Hematuria     (Consider location/radiation/quality/duration/timing/severity/associated sxs/prior Treatment) HPI Comments: Hx BPH, presented to ED last night with suprapubic pressure and urinary retention. Coudet catheter placed last night by nursing and foley left at time of discharge Hours after discharge from ED son and pt noticed hematuria. No clots or obstruction. No fevers. No lightheadedness  Patient is a 81 y.o. male presenting with hematuria.  Hematuria This is a new problem. The current episode started 3 to 5 hours ago. The problem occurs constantly. The problem has not changed since onset.Pertinent negatives include no chest pain, no abdominal pain (improved after foley placement), no headaches and no shortness of breath. Nothing aggravates the symptoms. Nothing relieves the symptoms. He has tried nothing for the symptoms.    Past Medical History  Diagnosis Date  . Hypertension   . Glaucoma   . Emphysema lung   . COPD (chronic obstructive pulmonary disease)   . CVA (cerebral infarction)   . BPH (benign prostatic hyperplasia)    History reviewed. No pertinent past surgical history. History reviewed. No pertinent family history. History  Substance Use Topics  . Smoking status: Former Smoker -- 0.50 packs/day for 20 years    Types: Cigarettes    Quit date: 04/26/1995  . Smokeless tobacco: Never Used  . Alcohol Use: No    Review of Systems  Constitutional: Negative for fever.  HENT: Negative for sore throat.   Eyes: Negative for visual disturbance.  Respiratory: Positive for cough. Negative for shortness of breath.   Cardiovascular: Negative for chest pain.  Gastrointestinal: Negative for nausea, vomiting, abdominal pain (improved after foley placement) and constipation.  Genitourinary: Positive for  hematuria and difficulty urinating (had it yesterday, now with foley in place).  Musculoskeletal: Negative for back pain and neck stiffness.  Skin: Negative for rash.  Neurological: Negative for syncope, light-headedness and headaches.      Allergies  Review of patient's allergies indicates no known allergies.  Home Medications   Prior to Admission medications   Medication Sig Start Date End Date Taking? Authorizing Provider  amLODipine (NORVASC) 10 MG tablet Take 10 mg by mouth daily.   Yes Historical Provider, MD  aspirin 81 MG tablet Take 81 mg by mouth daily.   Yes Historical Provider, MD  brimonidine (ALPHAGAN) 0.2 % ophthalmic solution Place 1 drop into the right eye 3 (three) times daily.    Yes Historical Provider, MD  brinzolamide (AZOPT) 1 % ophthalmic suspension Place 1 drop into both eyes 3 (three) times daily.    Yes Historical Provider, MD  ipratropium-albuterol (DUONEB) 0.5-2.5 (3) MG/3ML SOLN Take 3 mLs by nebulization 4 (four) times daily. Patient taking differently: Take 1.5 mLs by nebulization 4 (four) times daily.  04/26/15  Yes Michele Mcalpine, MD  predniSONE (DELTASONE) 10 MG tablet Take 10 mg by mouth daily with breakfast.   Yes Historical Provider, MD  rosuvastatin (CRESTOR) 10 MG tablet Take 10 mg by mouth daily.   Yes Historical Provider, MD  travoprost, benzalkonium, (TRAVATAN) 0.004 % ophthalmic solution Place 1 drop into both eyes at bedtime.    Yes Historical Provider, MD  azithromycin (ZITHROMAX) 250 MG tablet Take one daily starting on July 30. Patient not taking: Reported on 05/11/2015 05/11/15   Devoria Albe, MD   BP 124/80 mmHg  Pulse 83  Temp(Src) 98.6 F (37 C) (Oral)  Resp 16  Ht  (1.676 m)  Wt 91 lb 11.4 oz (41.6 kg)  BMI 14.81 kg/m2  SpO2 97% Physical Exam  Constitutional: He is oriented to person, place, and time. He appears well-developed and well-nourished. No distress.  HENT:  Head: Normocephalic and atraumatic.  Eyes: Conjunctivae and  EOM are normal.  Neck: Normal range of motion.  Cardiovascular: Normal rate, regular rhythm, normal heart sounds and intact distal pulses.  Exam reveals no gallop and no friction rub.   No murmur heard. Pulmonary/Chest: Effort normal and breath sounds normal. No respiratory distress. He has no wheezes. He has no rales.  Abdominal: Soft. He exhibits no distension. There is no tenderness. There is no guarding.  Musculoskeletal: He exhibits no edema.  Neurological: He is alert and oriented to person, place, and time.  Skin: Skin is warm and dry. He is not diaphoretic.  Nursing note and vitals reviewed.   ED Course  Procedures (including critical care time) Labs Review Labs Reviewed  CBC WITH DIFFERENTIAL/PLATELET - Abnormal; Notable for the following:    WBC 14.1 (*)    Platelets 135 (*)    Neutrophils Relative % 85 (*)    Neutro Abs 12.0 (*)    Lymphocytes Relative 5 (*)    Lymphs Abs 0.6 (*)    Monocytes Absolute 1.4 (*)    All other components within normal limits  BASIC METABOLIC PANEL - Abnormal; Notable for the following:    Chloride 100 (*)    Glucose, Bld 106 (*)    BUN 62 (*)    Creatinine, Ser 1.93 (*)    Calcium 8.6 (*)    GFR calc non Af Amer 28 (*)    GFR calc Af Amer 33 (*)    All other components within normal limits  BASIC METABOLIC PANEL  CBC    Imaging Review Dg Chest 2 View  05/10/2015   CLINICAL DATA:  Increasing shortness of breath for 2 days.  EXAM: CHEST  2 VIEW  COMPARISON:  04/03/2015  FINDINGS: Hyperinflation and emphysema. Small right basilar opacity and pleural effusion. Minimal atelectasis at the left lung base. The heart size is normal, mild tortuosity of the thoracic aorta. No pulmonary edema or pneumothorax. The bones are under mineralized.  IMPRESSION: 1. Right basilar opacity and pleural effusion. This may reflect pleural effusion with adjacent compressive atelectasis versus pneumonia and small parapneumonic effusion. 2. Emphysema.    Electronically Signed   By: Rubye Oaks M.D.   On: 05/10/2015 21:07   Dg Abd 1 View  05/11/2015   CLINICAL DATA:  Abdominal pain and distention.  EXAM: ABDOMEN - 1 VIEW  COMPARISON:  None.  FINDINGS: Gas-filled loops of large and small bowel. No loops are pathologically dilated. No air-fluid levels. Gas and stool in the rectum. Large calcification in the LEFT kidney is unchanged over multiple comparison exams.  IMPRESSION: Gas-filled loops of large and small bowel. No evidence of obstruction.   Electronically Signed   By: Genevive Bi M.D.   On: 05/11/2015 16:02   US Renal  05/11/2015   CLINICAL DATA:  Acute renal insufficiency.  Urinary retention.  EXAM: RENAL / URINARY TRACT ULTRASOUND COMPLETE  COMPARISON:  None.  FINDINGS: Right Kidney:  Length: 9.3 cm. There is a benign 4.5 cm cyst in the midpole. At least 1 calculus is present, measuring 5 mm. No hydronephrosis.  Left Kidney:  Length: 10.0 cm. At least 2  calculi are present, measuring up to 1.4 cm. No hydronephrosis.  Bladder:  Decompressed around a Foley catheter and not evaluated.  IMPRESSION: Negative for hydronephrosis.  Bilateral nephrolithiasis.   Electronically Signed   By: Ellery Plunk M.D.   On: 05/11/2015 18:06     EKG Interpretation None      MDM   Final diagnoses:  Urinary retention  AKI (acute kidney injury)  Constipation   81yo male with history of hypertension, hyperlipidemia, COPD, BPH, presentation to ED last night for concern for urinary retention with difficult foley placement with coude and discharge with foley as well as azithromycin for possible pneumonia or to supplement prednisone pt was getting as COPD treatment as outpatient, presents with concern for hematuria.  Pt with presence of hematuria on exam, however no clots, no urinary retention. Irrigated the foley and able to clear urine without significant hemorrhage.  CBC shows hgb at baseline and leukocytosis likely secondary to steroids.  BMP  evaluated given recent obstruction and shows acute kidney injury with creatinine to 1.93 from previous .8.  Patient to be admitted to hospitalist for continued evaluation of AKI.    Alvira Monday, MD 05/11/15 [phone]

## 2015-05-11 NOTE — H&P (Addendum)
History and Physical  Jesus Carey ZOX:[id number] DOB: 1920/06/11 DOA: 05/11/2015  Referring physician: Dr. Alvira Monday, EDP PCP: Jesus Junes, MD  Outpatient Specialists:  1. Pulmonology: Dr. Olean Ree  Chief Complaint: Blood in urine  HPI: Jesus Carey is a 81 y.o. male 81 year old African-American male with history of COPD, on prednisone (?chronic), chronic hypoxic respiratory failure on intermittent home O2, HTN, HLD, chronic constipation, presented to the [facility] ED on 05/11/15 with hematuria. Patient was seen in ED on 05/10/15 for constipation and urinary retention. Foley catheter was placed and his symptoms of abdominal pain resolved and patient was discharged home. This morning patient's son noticed blood in the Foley catheter/bag and was concerned and called the ED and was advised to come in for evaluation. In the ED, creatinine 1.93, WBC 14.1, platelets 135. Due to concern for acute kidney injury, hospitalist admission was requested. Patient denies abdominal pain, nausea, vomiting. He has been using OTC laxatives and last BM was 2 days ago. Passing flatus. Denies dyspnea, cough, chest pain. No dysuria or urinary frequency. No fever or chills reported.   Review of Systems: All systems reviewed and apart from history of presenting illness, are negative.  Past Medical History  Diagnosis Date  . Hypertension   . Glaucoma   . Emphysema lung   . COPD (chronic obstructive pulmonary disease)   . CVA (cerebral infarction)   . BPH (benign prostatic hyperplasia)    History reviewed. No pertinent past surgical history. Social History:  reports that he quit smoking about 20 years ago. His smoking use included Cigarettes. He has a 10 pack-year smoking history. He has never used smokeless tobacco. He reports that he does not drink alcohol or use illicit drugs. Patient's son lives with him. Independent of activities of daily living.  No Known  Allergies  History reviewed. No pertinent family history. interviewed patient repeatedly and he denies any significant family history.  Prior to Admission medications   Medication Sig Start Date End Date Taking? Authorizing Provider  amLODipine (NORVASC) 10 MG tablet Take 10 mg by mouth daily.   Yes Historical Provider, MD  aspirin 81 MG tablet Take 81 mg by mouth daily.   Yes Historical Provider, MD  brimonidine (ALPHAGAN) 0.2 % ophthalmic solution Place 1 drop into the right eye 3 (three) times daily.    Yes Historical Provider, MD  brinzolamide (AZOPT) 1 % ophthalmic suspension Place 1 drop into both eyes 3 (three) times daily.    Yes Historical Provider, MD  ipratropium-albuterol (DUONEB) 0.5-2.5 (3) MG/3ML SOLN Take 3 mLs by nebulization 4 (four) times daily. Patient taking differently: Take 1.5 mLs by nebulization 4 (four) times daily.  04/26/15  Yes Michele Mcalpine, MD  predniSONE (DELTASONE) 10 MG tablet Take 10 mg by mouth daily with breakfast.   Yes Historical Provider, MD  rosuvastatin (CRESTOR) 10 MG tablet Take 10 mg by mouth daily.   Yes Historical Provider, MD  travoprost, benzalkonium, (TRAVATAN) 0.004 % ophthalmic solution Place 1 drop into both eyes 3 (three) times daily.    Yes Historical Provider, MD  azithromycin (ZITHROMAX) 250 MG tablet Take one daily starting on July 30. Patient not taking: Reported on 05/11/2015 05/11/15   Devoria Albe, MD   Physical Exam: Filed Vitals:   05/11/15 0941 05/11/15 1118 05/11/15 1246  BP: 126/78 116/69 127/68  Pulse: 90 80 83  Temp: 97.8 F (36.6 C) 98 F (36.7 C)   TempSrc: Oral Oral   Resp:  16 16 16   SpO2: 94% 93% 94%     General exam: Small built and thinly nourished pleasant elderly male lying comfortably supine on the gurney in no obvious distress.  Head, eyes and ENT: Nontraumatic and normocephalic. Pupils equally reacting to light and accommodation. Oral mucosa moist. Right pupil irregular/horizontally oblong. Bilateral arcus  senilis. Left mature cataract. Edentulous.  Neck: Supple. No JVD, carotid bruit or thyromegaly.  Lymphatics: No lymphadenopathy.  Respiratory system: Clear to auscultation. No increased work of breathing.  Cardiovascular system: S1 and S2 heard, RRR. No JVD, murmurs, gallops, clicks or pedal edema.  Gastrointestinal system: Abdomen is minimally distended but soft and nontender. Normal bowel sounds heard. No organomegaly or masses appreciated.  Central nervous system: Alert and oriented. No focal neurological deficits.  Extremities: Symmetric 5 x 5 power. Peripheral pulses symmetrically felt.   Skin: No rashes or acute findings.  Musculoskeletal system: Negative exam.  Psychiatry: Pleasant and cooperative.   Labs on Admission:  Basic Metabolic Panel:  Recent Labs Lab 05/11/15 1011  NA 137  K 4.3  CL 100*  CO2 26  GLUCOSE 106*  BUN 62*  CREATININE 1.93*  CALCIUM 8.6*   Liver Function Tests: No results for input(s): AST, ALT, ALKPHOS, BILITOT, PROT, ALBUMIN in the last 168 hours. No results for input(s): LIPASE, AMYLASE in the last 168 hours. No results for input(s): AMMONIA in the last 168 hours. CBC:  Recent Labs Lab 05/11/15 1011  WBC 14.1*  NEUTROABS 12.0*  HGB 14.7  HCT 44.2  MCV 87.9  PLT 135*   Cardiac Enzymes: No results for input(s): CKTOTAL, CKMB, CKMBINDEX, TROPONINI in the last 168 hours.  BNP (last 3 results)  Recent Labs  08/06/14 1849  PROBNP 197.7   CBG: No results for input(s): GLUCAP in the last 168 hours.  Radiological Exams on Admission: Dg Chest 2 View  05/10/2015   CLINICAL DATA:  Increasing shortness of breath for 2 days.  EXAM: CHEST  2 VIEW  COMPARISON:  04/03/2015  FINDINGS: Hyperinflation and emphysema. Small right basilar opacity and pleural effusion. Minimal atelectasis at the left lung base. The heart size is normal, mild tortuosity of the thoracic aorta. No pulmonary edema or pneumothorax. The bones are under  mineralized.  IMPRESSION: 1. Right basilar opacity and pleural effusion. This may reflect pleural effusion with adjacent compressive atelectasis versus pneumonia and small parapneumonic effusion. 2. Emphysema.   Electronically Signed   By: Rubye Oaks M.D.   On: 05/10/2015 21:07    EKG: Independently reviewed. SR with with occ PVC's and no acute changes.  Assessment/Plan Principal Problem:   AKI (acute kidney injury) Active Problems:   HLD (hyperlipidemia)   Essential hypertension   BPH (benign prostatic hyperplasia)   COPD (chronic obstructive pulmonary disease)   Acute urinary retention   Hematuria   Constipation   Acute kidney injury - Most likely related to acute urinary retention. - Creatinine on 04/03/15:0.93. Admitting creatinine: 1.93. - Foley catheter placed 7/28. Continue same - Check renal ultrasound to rule out hydronephrosis or other pathology - Brief IV fluids and follow BMP in a.m.  Acute urinary retention - Secondary to prostatomegaly and may be complicated by constipation - Continue Foley catheter. Will discuss with on-call urologist regarding further management i.e. DC home when stable with Foley catheter and outpatient urology follow-up versus trial of voiding in the hospital.  Hematuria, mild - Likely from Foley trauma. Avoid heparin products. Monitor and expected to improve.  Constipation - Check KUB for  stool burden. MiraLAX and senna.  Essential hypertension - Controlled on home medications-continue  HLD - Continue statins  COPD,? Steroid dependent, chronic respiratory failure on intermittent home oxygen - Stable. Although chest x-ray reported as atelectasis versus pneumonia-clinical picture is not consistent with pneumonia and more with atelectasis. - Continue bronchodilators and home dose of oral prednisone.      DVT prophylaxis: SCDs Code Status: Full  Family Communication: Discussed with patient's son via phone.  Disposition Plan: DC  home when medically stable, possibly in the next 1-2 days.   Time spent: 60 minutes  Dartanian Knaggs, MD, FACP, FHM. [facility] Pager [phone]  If 7PM-7AM, please contact night-coverage [email or web address] Password [facility] 05/11/2015, 1:31 PM   Addendum  Discussed patient's case with Urologist on call on 05/11/15 who recommended starting tamsulosin, DC home with Foley catheter and outpatient follow-up with urology.  Marcellus Scott, MD, FACP, FHM. [facility] Pager [phone]  If 7PM-7AM, please contact night-coverage [email or web address] Password [email or web address] 05/12/2015, 4:10 PM

## 2015-05-11 NOTE — ED Notes (Signed)
Jesus Carey/son [phone]

## 2015-05-11 NOTE — ED Provider Notes (Addendum)
CSN: [id number]     Arrival date & time 05/10/15  1949 History  This chart was scribed for Devoria Albe, MD by Lyndel Safe, ED Scribe. This patient was seen in room WA21/WA21 and the patient's care was started 12:14 AM.   Chief Complaint  Patient presents with  . Shortness of Breath  . Constipation  . Medication Reaction   The history is provided by the patient. The history is limited by the condition of the patient. No language interpreter was used.   LEVEL 5 CAVEAT: AGE   HPI Comments: Jesus Carey is a 81 y.o. male, with a PMhx of HTN, emphysema, COPD, CVA, and BPH, who presents to the Emergency Department complaining of constant, moderate abdominal pain with constipation onset 3 days ago. He notes associated difficulty urinating. Pt states last normal BM was 3 days ago. Pt is on home O2 PRN. He resides with his son who reports to the nurse pt has been on prednisone and albuterol treatments for 3 days for his COPD. he denies nausea or vomiting.  PCP Dr Bruna Potter   Past Medical History  Diagnosis Date  . Hypertension   . Glaucoma   . Emphysema lung   . COPD (chronic obstructive pulmonary disease)   . CVA (cerebral infarction)   . BPH (benign prostatic hyperplasia)    History reviewed. No pertinent past surgical history. No family history on file. History  Substance Use Topics  . Smoking status: Former Smoker -- 0.50 packs/day for 20 years    Types: Cigarettes    Quit date: 04/26/1995  . Smokeless tobacco: Not on file  . Alcohol Use: No  lives at home Lives with son Oxygen as needed  Review of Systems  Unable to perform ROS: Age  Respiratory: Positive for shortness of breath.   Gastrointestinal: Positive for constipation.  Genitourinary: Positive for dysuria.   Allergies  Review of patient's allergies indicates no known allergies.  Home Medications   Prior to Admission medications   Medication Sig Start Date End Date Taking? Authorizing Provider  amLODipine  (NORVASC) 10 MG tablet Take 10 mg by mouth daily.   Yes Historical Provider, MD  aspirin 81 MG tablet Take 81 mg by mouth daily.   Yes Historical Provider, MD  brimonidine (ALPHAGAN) 0.2 % ophthalmic solution Place 1 drop into the right eye 3 (three) times daily.    Yes Historical Provider, MD  brinzolamide (AZOPT) 1 % ophthalmic suspension Place 1 drop into both eyes 3 (three) times daily.    Yes Historical Provider, MD  ipratropium-albuterol (DUONEB) 0.5-2.5 (3) MG/3ML SOLN Take 3 mLs by nebulization 4 (four) times daily. Patient taking differently: Take 1.5 mLs by nebulization 4 (four) times daily.  04/26/15  Yes Michele Mcalpine, MD  predniSONE (DELTASONE) 10 MG tablet Take 10 mg by mouth daily with breakfast.   Yes Historical Provider, MD  rosuvastatin (CRESTOR) 10 MG tablet Take 10 mg by mouth daily.   Yes Historical Provider, MD  travoprost, benzalkonium, (TRAVATAN) 0.004 % ophthalmic solution Place 1 drop into both eyes 3 (three) times daily.    Yes Historical Provider, MD  albuterol (PROVENTIL HFA;VENTOLIN HFA) 108 (90 BASE) MCG/ACT inhaler Inhale 2 puffs into the lungs every 4 (four) hours as needed for wheezing or shortness of breath. Patient not taking: Reported on 05/10/2015 02/22/15   Azalia Bilis, MD  azithromycin [facility]) 250 MG tablet Take one daily starting on July 30. 05/11/15   Devoria Albe, MD  guaiFENesin (ROBITUSSIN) 100 MG/5ML  liquid Take 5-10 mLs (100-200 mg total) by mouth 3 (three) times daily as needed for cough. Patient not taking: Reported on 04/26/2015 03/15/15   Elwin Mocha, MD  levofloxacin (LEVAQUIN) 750 MG tablet Take 1 tablet (750 mg total) by mouth daily. Patient not taking: Reported on 04/26/2015 04/04/15   Clydia Llano, MD  predniSONE (DELTASONE) 20 MG tablet Take 1 tablet (20 mg total) by mouth daily with breakfast. Patient not taking: Reported on 05/10/2015 04/26/15   Michele Mcalpine, MD  predniSONE (STERAPRED UNI-PAK 21 TAB) 10 MG (21) TBPK tablet Take 6-5-4-3-2-1 tablet  PO daily till gone Patient not taking: Reported on 04/26/2015 04/04/15   Clydia Llano, MD  tamsulosin (FLOMAX) 0.4 MG CAPS capsule Take 1 capsule (0.4 mg total) by mouth at bedtime. Patient not taking: Reported on 04/26/2015 06/07/13   Hayden Rasmussen, NP   BP 141/80 mmHg  Pulse 68  Temp(Src) 98.1 F (36.7 C) (Oral)  Resp 23  SpO2 96%  Vital signs normal   Physical Exam  Constitutional:  Non-toxic appearance. He does not appear ill. No distress.  Frail, elderly male.   HENT:  Head: Normocephalic and atraumatic.  Right Ear: External ear normal.  Left Ear: External ear normal.  Nose: Nose normal. No mucosal edema or rhinorrhea.  Mouth/Throat: Oropharynx is clear and moist and mucous membranes are normal. No dental abscesses or uvula swelling.  Eyes: Conjunctivae and EOM are normal. Pupils are equal, round, and reactive to light.  Neck: Normal range of motion and full passive range of motion without pain. Neck supple.  Cardiovascular: Normal rate, regular rhythm and normal heart sounds.  Exam reveals no gallop and no friction rub.   No murmur heard. Pulmonary/Chest: Effort normal and breath sounds normal. No respiratory distress. He has no wheezes. He has no rhonchi. He has no rales. He exhibits no tenderness and no crepitus.  Abdominal: Soft. Normal appearance and bowel sounds are normal. He exhibits distension. There is no tenderness. There is no rebound and no guarding.  Lower abdomen distended and tight.   Musculoskeletal: Normal range of motion. He exhibits no edema or tenderness.  Moves all extremities well.   Neurological: He is alert. He has normal strength. No cranial nerve deficit.  Skin: Skin is warm, dry and intact. No rash noted. No erythema. No pallor.  Psychiatric: He has a normal mood and affect. His speech is normal and behavior is normal. His mood appears not anxious.  Nursing note and vitals reviewed.   ED Course  Procedures   Medications  albuterol (PROVENTIL) (2.5  MG/3ML) 0.083% nebulizer solution 5 mg (5 mg Nebulization Given 05/10/15 2054)  azithromycin (ZITHROMAX) tablet 500 mg (500 mg Oral Given 05/11/15 0307)     DIAGNOSTIC STUDIES: Oxygen Saturation is 96% on 2L/min [place] , adequate by my interpretation.    COORDINATION OF CARE: 12:17 AM Discussed treatment plan with pt. Will order diagnostic images and labs. Pt acknowledges and agrees to plan. Patient had already received albuterol nebulizer treatment and he states his breathing is improved.  Nursing staff report his bladder scan read greater than 800 mL of urine. Nursing staff had attempted to do a Foley catheter however they could not get it to pass his prostate. A coud catheter was done by the RN.  2:21 AM Discussed catheter placement with care giver. Pt reports no SOB and states he is feeling better since he was started on the medication 3 days ago.. Discussed Chest Xray findings of mild PNA. Denies  coughing or fever. Pt and caregiver acknowledge and agree to plan. They feel he can go home. Patient was started on oral Zithromax for his community-acquired pneumonia.  Labs Review Results for orders placed or performed during the hospital encounter of 05/10/15  Urinalysis, Routine w reflex microscopic (not at [facility])  Result Value Ref Range   Color, Urine YELLOW YELLOW   APPearance CLOUDY (A) CLEAR   Specific Gravity, Urine 1.013 1.005 - 1.030   pH 7.0 5.0 - 8.0   Glucose, UA NEGATIVE NEGATIVE mg/dL   Hgb urine dipstick LARGE (A) NEGATIVE   Bilirubin Urine NEGATIVE NEGATIVE   Ketones, ur NEGATIVE NEGATIVE mg/dL   Protein, ur NEGATIVE NEGATIVE mg/dL   Urobilinogen, UA 0.2 0.0 - 1.0 mg/dL   Nitrite NEGATIVE NEGATIVE   Leukocytes, UA MODERATE (A) NEGATIVE  Urine microscopic-add on  Result Value Ref Range   WBC, UA 3-6 <3 WBC/hpf   RBC / HPF 7-10 <3 RBC/hpf   Urine-Other AMORPHOUS URATES/PHOSPHATES    Laboratory interpretation all normal except ? UTI, urine culture sent     Imaging  Review Dg Chest 2 View  05/10/2015   CLINICAL DATA:  Increasing shortness of breath for 2 days.  EXAM: CHEST  2 VIEW  COMPARISON:  04/03/2015  FINDINGS: Hyperinflation and emphysema. Small right basilar opacity and pleural effusion. Minimal atelectasis at the left lung base. The heart size is normal, mild tortuosity of the thoracic aorta. No pulmonary edema or pneumothorax. The bones are under mineralized.  IMPRESSION: 1. Right basilar opacity and pleural effusion. This may reflect pleural effusion with adjacent compressive atelectasis versus pneumonia and small parapneumonic effusion. 2. Emphysema.   Electronically Signed   By: Rubye Oaks M.D.   On: 05/10/2015 21:07     EKG Interpretation   Date/Time:  Thursday May 10 2015 20:38:14 EDT Ventricular Rate:  65 PR Interval:  146 QRS Duration: 97 QT Interval:  400 QTC Calculation: 416 R Axis:   53 Text Interpretation:  Sinus rhythm Ventricular premature complex Probable  left atrial enlargement Low voltage, extremity leads No significant change  since last tracing 03 Apr 2015 Confirmed by Delene Morais  MD-I, Melina Mosteller ([id number]) on  05/10/2015 11:03:52 PM      MDM   Final diagnoses:  Acute urinary retention  Constipation, unspecified constipation type   Discharge Medication List as of 05/11/2015  2:48 AM    START taking these medications   Details  azithromycin (ZITHROMAX) 250 MG tablet Take one daily starting on July 30., Print        Plan discharge  Devoria Albe, MD, FACEP    I personally performed the services described in this documentation, which was scribed in my presence. The recorded information has been reviewed and considered.  Devoria Albe, MD, Concha Pyo, MD 05/11/15 6045  Devoria Albe, MD 05/11/15 [phone]

## 2015-05-12 DIAGNOSIS — E785 Hyperlipidemia, unspecified: Secondary | ICD-10-CM

## 2015-05-12 DIAGNOSIS — K5900 Constipation, unspecified: Secondary | ICD-10-CM

## 2015-05-12 DIAGNOSIS — N179 Acute kidney failure, unspecified: Secondary | ICD-10-CM | POA: Diagnosis not present

## 2015-05-12 DIAGNOSIS — R339 Retention of urine, unspecified: Secondary | ICD-10-CM | POA: Diagnosis not present

## 2015-05-12 DIAGNOSIS — R319 Hematuria, unspecified: Secondary | ICD-10-CM

## 2015-05-12 DIAGNOSIS — J439 Emphysema, unspecified: Secondary | ICD-10-CM

## 2015-05-12 DIAGNOSIS — R338 Other retention of urine: Secondary | ICD-10-CM | POA: Diagnosis not present

## 2015-05-12 DIAGNOSIS — N400 Enlarged prostate without lower urinary tract symptoms: Secondary | ICD-10-CM

## 2015-05-12 DIAGNOSIS — I10 Essential (primary) hypertension: Secondary | ICD-10-CM

## 2015-05-12 LAB — URINE CULTURE
CULTURE: NO GROWTH
SPECIAL REQUESTS: NORMAL

## 2015-05-12 LAB — CBC
HCT: 40.1 % (ref 39.0–52.0)
Hemoglobin: 13.2 g/dL (ref 13.0–17.0)
MCH: 29.5 pg (ref 26.0–34.0)
MCHC: 32.9 g/dL (ref 30.0–36.0)
MCV: 89.5 fL (ref 78.0–100.0)
PLATELETS: 118 10*3/uL — AB (ref 150–400)
RBC: 4.48 MIL/uL (ref 4.22–5.81)
RDW: 14.6 % (ref 11.5–15.5)
WBC: 9.8 10*3/uL (ref 4.0–10.5)

## 2015-05-12 LAB — BASIC METABOLIC PANEL
Anion gap: 6 (ref 5–15)
BUN: 39 mg/dL — AB (ref 6–20)
CHLORIDE: 109 mmol/L (ref 101–111)
CO2: 26 mmol/L (ref 22–32)
Calcium: 7.8 mg/dL — ABNORMAL LOW (ref 8.9–10.3)
Creatinine, Ser: 0.99 mg/dL (ref 0.61–1.24)
GFR calc Af Amer: >60 mL/min (ref >60)
GLUCOSE: 108 mg/dL — AB (ref 65–99)
Potassium: 3.8 mmol/L (ref 3.5–5.1)
Sodium: 141 mmol/L (ref 135–145)

## 2015-05-12 MED ORDER — TAMSULOSIN HCL 0.4 MG PO CAPS: 0.4000 mg | ORAL_CAPSULE | Freq: Every day | ORAL | Status: AC

## 2015-05-12 NOTE — Care Management Note (Signed)
Case Management Note  Patient Details  Name: Jesus Carey MRN: [id number] Date of Birth: [birth year]  Subjective/Objective:    hematuria              Action/Plan: Home Health  Expected Discharge Date:  05/12/2015               Expected Discharge Plan:  Home w Home Health Services  In-House Referral:     Discharge planning Services  CM Consult  Post Acute Care Choice:  Home Health Choice offered to:  Patient      HH Arranged:  RN [facility] Agency:  Other - See comment  Status of Service:  Completed, signed off  Medicare Important Message Given:    Date Medicare IM Given:    Medicare IM give by:    Date Additional Medicare IM Given:    Additional Medicare Important Message give by:     If discussed at Long Length of Stay Meetings, dates discussed:    Additional Comments: NCM spoke to pt and gave permission to speak to son, [name] [phone]. States pt has oxygen and neb machine at home. Offered choice for [facility]. Pt agreeable to [facility] for [facility] for scheduled dc home today. [name] RN CCM Case Mgmt phone [phone] Elliot Cousin, RN 05/12/2015, 12:32 PM

## 2015-05-12 NOTE — Discharge Summary (Signed)
Physician Discharge Summary  Jesus Carey UJW:[id number] DOB: 07/18/1920 DOA: 05/11/2015  PCP: Burtis Junes, MD  Admit date: 05/11/2015 Discharge date: 05/12/2015   Recommendations for Outpatient Follow-Up:   1. Outpatient follow-up with urology to evaluate urinary retention recommended.   Discharge Diagnosis:   Principal Problem:    AKI (acute kidney injury) Active Problems:    HLD (hyperlipidemia)    Essential hypertension    BPH (benign prostatic hyperplasia)    COPD (chronic obstructive pulmonary disease)    Acute urinary retention    Hematuria    Constipation   Discharge disposition:  Home.    Discharge Condition: Improved.  Diet recommendation: Low sodium, heart healthy.    History of Present Illness:   81 y.o. male 81 year old African-American male with history of COPD, on prednisone (?chronic), chronic hypoxic respiratory failure on intermittent home O2, HTN, HLD, chronic constipation, presented to the [facility] ED on 05/11/15 with hematuria. Patient was seen in ED on 05/10/15 for constipation and urinary retention. Foley catheter was placed and his symptoms of abdominal pain resolved and patient was discharged home. Patient was brought back for evaluation of hematuria on 05/11/15. In the ED, creatinine 1.93, WBC 14.1, platelets 135. Due to concern for acute kidney injury, hospitalist admission was requested.  Hospital Course by Problem:   Acute kidney injury - Most likely related to acute urinary retention. Gently hydrated overnight. - Creatinine on 04/03/15:0.93. Admitting creatinine: 1.93. Creatinine at discharge improved to 0.99. - Foley catheter placed 7/28. Continue same, with outpatient follow-up with urology. - Renal ultrasound negative for hydronephrosis or other pathology.  Acute urinary retention - Secondary to prostatomegaly and may be complicated by constipation - Continue Foley catheter.  - Case discussed with on-call  urologist, outpatient urology follow-up recommended.  Hematuria, mild - Likely from Foley trauma or kidney stone. Given a urine strainer and instructed to strain urine.  Constipation - MiraLAX and senna ordered.  Essential hypertension - Controlled on home medications-continue.  HLD - Continue statins.  COPD,? Steroid dependent, chronic respiratory failure on intermittent home oxygen - Stable.  - CXR showed atelectasis vs PNA. - Clinical picture is not consistent with pneumonia and more consistent with atelectasis. - Continue bronchodilators and home dose of oral prednisone. Discontinue antibiotics.  Medical Consultants:    Telephone consultation with urologist on call   Discharge Exam:   Filed Vitals:   05/12/15 0606  BP: 120/65  Pulse: 75  Temp: 98.2 F (36.8 C)  Resp: 16   Filed Vitals:   05/11/15 1943 05/11/15 2121 05/12/15 0606 05/12/15 1032  BP:  124/80 120/65   Pulse:   75   Temp:  98.6 F (37 C) 98.2 F (36.8 C)   TempSrc:  Oral Oral   Resp:  16 16   Height:      Weight:   44.2 kg (97 lb 7.1 oz)   SpO2: 94% 97% 96% 93%    Gen:  NAD Cardiovascular:  RRR, No M/R/G Respiratory: Lungs CTAB Gastrointestinal: Abdomen soft, NT/ND with normal active bowel sounds. Extremities: No C/E/C   The results of significant diagnostics from this hospitalization (including imaging, microbiology, ancillary and laboratory) are listed below for reference.     Procedures and Diagnostic Studies:   Dg Chest 2 View  05/10/2015   CLINICAL DATA:  Increasing shortness of breath for 2 days.  EXAM: CHEST  2 VIEW  COMPARISON:  04/03/2015  FINDINGS: Hyperinflation and emphysema. Small right basilar opacity and pleural effusion. Minimal  atelectasis at the left lung base. The heart size is normal, mild tortuosity of the thoracic aorta. No pulmonary edema or pneumothorax. The bones are under mineralized.  IMPRESSION: 1. Right basilar opacity and pleural effusion. This may reflect  pleural effusion with adjacent compressive atelectasis versus pneumonia and small parapneumonic effusion. 2. Emphysema.   Electronically Signed   By: Rubye Oaks M.D.   On: 05/10/2015 21:07   Dg Abd 1 View  05/11/2015   CLINICAL DATA:  Abdominal pain and distention.  EXAM: ABDOMEN - 1 VIEW  COMPARISON:  None.  FINDINGS: Gas-filled loops of large and small bowel. No loops are pathologically dilated. No air-fluid levels. Gas and stool in the rectum. Large calcification in the LEFT kidney is unchanged over multiple comparison exams.  IMPRESSION: Gas-filled loops of large and small bowel. No evidence of obstruction.   Electronically Signed   By: Genevive Bi M.D.   On: 05/11/2015 16:02   US Renal  05/11/2015   CLINICAL DATA:  Acute renal insufficiency.  Urinary retention.  EXAM: RENAL / URINARY TRACT ULTRASOUND COMPLETE  COMPARISON:  None.  FINDINGS: Right Kidney:  Length: 9.3 cm. There is a benign 4.5 cm cyst in the midpole. At least 1 calculus is present, measuring 5 mm. No hydronephrosis.  Left Kidney:  Length: 10.0 cm. At least 2 calculi are present, measuring up to 1.4 cm. No hydronephrosis.  Bladder:  Decompressed around a Foley catheter and not evaluated.  IMPRESSION: Negative for hydronephrosis.  Bilateral nephrolithiasis.   Electronically Signed   By: Ellery Plunk M.D.   On: 05/11/2015 18:06     Labs:   Basic Metabolic Panel:  Recent Labs Lab 05/11/15 1011 05/12/15 0445  NA 137 141  K 4.3 3.8  CL 100* 109  CO2 26 26  GLUCOSE 106* 108*  BUN 62* 39*  CREATININE 1.93* 0.99  CALCIUM 8.6* 7.8*   GFR Estimated Creatinine Clearance: 29.1 mL/min (by C-G formula based on Cr of 0.99).  CBC:  Recent Labs Lab 05/11/15 1011 05/12/15 0445  WBC 14.1* 9.8  NEUTROABS 12.0*  --   HGB 14.7 13.2  HCT 44.2 40.1  MCV 87.9 89.5  PLT 135* 118*   Microbiology Recent Results (from the past 240 hour(s))  Urine culture     Status: None   Collection Time: 05/11/15 12:27 AM    Result Value Ref Range Status   Specimen Description URINE, CATHETERIZED  Final   Special Requests Normal  Final   Culture   Final    NO GROWTH 1 DAY Performed at [facility]    Report Status 05/12/2015 FINAL  Final     Discharge Instructions:   Discharge Instructions    Call MD for:  extreme fatigue    Complete by:  As directed      Call MD for:  severe uncontrolled pain    Complete by:  As directed      Call MD for:  temperature >100.4    Complete by:  As directed      Call MD for:    Complete by:  As directed   Passing blood clots.     Diet - low sodium heart healthy    Complete by:  As directed      Discharge instructions    Complete by:  As directed   You may notice some blood tinged urine.  This is related to having a kidney stone.  If it persists, hold your aspirin for 1 week.  Call your urologist if you start passing blood clots or have fevers.     Face-to-face encounter (required for Medicare/Medicaid patients)    Complete by:  As directed   I Kaamil Morefield certify that this patient is under my care and that I, or a nurse practitioner or physician's assistant working with me, had a face-to-face encounter that meets the physician face-to-face encounter requirements with this patient on 05/12/2015. The encounter with the patient was in whole, or in part for the following medical condition(s) which is the primary reason for home health care (List medical condition): Teach patient foley care, disease management.  Please do wound care to back abrasion (cover with soft silicone dressing PRN).  The encounter with the patient was in whole, or in part, for the following medical condition, which is the primary reason for home health care:  D/Cing home with foley, urinary retention, nephrolithiasis  I certify that, based on my findings, the following services are medically necessary home health services:  Nursing  Reason for Medically Necessary Home Health Services:  Skilled  Nursing- Skilled Assessment/Observation  My clinical findings support the need for the above services:  Unable to leave home safely without assistance and/or assistive device  Further, I certify that my clinical findings support that this patient is homebound due to:  Unable to leave home safely without assistance     Home Health    Complete by:  As directed   To provide the following care/treatments:  RN     Increase activity slowly    Complete by:  As directed      Walk with assistance    Complete by:  As directed      Walker     Complete by:  As directed             Medication List    STOP taking these medications        azithromycin 250 MG tablet  Commonly known as:  ZITHROMAX      TAKE these medications        amLODipine 10 MG tablet  Commonly known as:  NORVASC  Take 10 mg by mouth daily.     aspirin 81 MG tablet  Take 81 mg by mouth daily.     brimonidine 0.2 % ophthalmic solution  Commonly known as:  ALPHAGAN  Place 1 drop into the right eye 3 (three) times daily.     brinzolamide 1 % ophthalmic suspension  Commonly known as:  AZOPT  Place 1 drop into both eyes 3 (three) times daily.     ipratropium-albuterol 0.5-2.5 (3) MG/3ML Soln  Commonly known as:  DUONEB  Take 3 mLs by nebulization 4 (four) times daily.     predniSONE 10 MG tablet  Commonly known as:  DELTASONE  Take 10 mg by mouth daily with breakfast.     rosuvastatin 10 MG tablet  Commonly known as:  CRESTOR  Take 10 mg by mouth daily.     tamsulosin 0.4 MG Caps capsule  Commonly known as:  FLOMAX  Take 1 capsule (0.4 mg total) by mouth daily.     travoprost (benzalkonium) 0.004 % ophthalmic solution  Commonly known as:  TRAVATAN  Place 1 drop into both eyes at bedtime.           Follow-up Information    Follow up with Burtis Junes, MD. Schedule an appointment as soon as possible for a visit in 1 week.   Specialty:  Family Medicine  Why:  Hospital follow up.   Contact  information:   [address] [address] [facility] [address] [phone]       Follow up with [facility].   Why:  Home Health RN   Contact information:   [phone]       Time coordinating discharge: 25 minutes.  Signed:  Asma Boldon  Pager [phone] [facility] 05/12/2015, 1:42 PM

## 2015-05-12 NOTE — Progress Notes (Signed)
Patient discharged to home with family, discharge instructions reviewed with patient and son who verbalized understanding. Patient and son instructed on changing foley catheter bag to leg bag and on how to strain urine.

## 2015-05-28 ENCOUNTER — Ambulatory Visit: Payer: Medicare Other | Admitting: Pulmonary Disease

## 2015-05-28 ENCOUNTER — Telehealth: Payer: Self-pay | Admitting: Pulmonary Disease

## 2015-05-28 ENCOUNTER — Other Ambulatory Visit: Payer: Self-pay | Admitting: *Deleted

## 2015-05-28 DIAGNOSIS — J439 Emphysema, unspecified: Secondary | ICD-10-CM

## 2015-05-28 MED ORDER — IPRATROPIUM-ALBUTEROL 0.5-2.5 (3) MG/3ML IN SOLN: 3.0000 mL | Freq: Four times a day (QID) | RESPIRATORY_TRACT | Status: DC

## 2015-05-28 NOTE — Telephone Encounter (Signed)
ATC line rang numerous times NA [facility]

## 2015-05-28 NOTE — Telephone Encounter (Signed)
[phone], pt son cb

## 2015-05-28 NOTE — Telephone Encounter (Signed)
Order placed for pt to receive meds through [facility].  rx printed and given to Marcelino Duster to have VS sign as she is working with him this afternoon.   Lateef (pt's EC) is aware of order. Nothing further needed at this time.

## 2015-05-28 NOTE — Telephone Encounter (Signed)
[phone], pt son cb °

## 2015-05-28 NOTE — Telephone Encounter (Signed)
Called Son and we were d/c'd. Called back and received VM. LMTCB x1

## 2015-05-28 NOTE — Telephone Encounter (Signed)
Okay to send order to [facility].

## 2015-05-28 NOTE — Telephone Encounter (Signed)
Spoke with pt's son, states that pt has 5 tubes of albuterol left.  Pt has 3 rx's left that can be filled if needed but pt would prefer to have this sent through [facility].   Called [facility], states that they need an order, rx, and last office note faxed to [phone]. Sending to doc of day in SN's absence.  Dr. Craige Cotta are you ok with this order?  Thanks!

## 2015-05-28 NOTE — Telephone Encounter (Signed)
Patient's son is afraid patient is going to run out of his nebulizer and would like a call back ASAP.  [phone].

## 2015-05-29 ENCOUNTER — Telehealth: Payer: Self-pay | Admitting: Pulmonary Disease

## 2015-05-29 NOTE — Telephone Encounter (Signed)
Called [facility] and spoke with Larita Fife. She reports they have everything they need. They will be ordering pt meds this afternoon and usually gets this the next day.  Called made Lateef aware. Nothing further needed

## 2015-06-05 ENCOUNTER — Ambulatory Visit (INDEPENDENT_AMBULATORY_CARE_PROVIDER_SITE_OTHER): Payer: Medicare Other | Admitting: Pulmonary Disease

## 2015-06-05 ENCOUNTER — Encounter: Payer: Self-pay | Admitting: Pulmonary Disease

## 2015-06-05 VITALS — BP 110/70 | HR 66 | Temp 97.0°F | Wt 95.2 lb

## 2015-06-05 DIAGNOSIS — R627 Adult failure to thrive: Secondary | ICD-10-CM | POA: Insufficient documentation

## 2015-06-05 DIAGNOSIS — J9611 Chronic respiratory failure with hypoxia: Secondary | ICD-10-CM

## 2015-06-05 DIAGNOSIS — J418 Mixed simple and mucopurulent chronic bronchitis: Secondary | ICD-10-CM

## 2015-06-05 DIAGNOSIS — E46 Unspecified protein-calorie malnutrition: Secondary | ICD-10-CM | POA: Diagnosis not present

## 2015-06-05 MED ORDER — PREDNISONE 10 MG PO TABS: 10.0000 mg | ORAL_TABLET | Freq: Every day | ORAL | Status: DC

## 2015-06-05 NOTE — Progress Notes (Signed)
Subjective:     Patient ID: Jesus Carey, male   DOB: 09/27/1920, 81 y.o.   MRN: [id number]  HPI ~  April 26, 2015:  Initial pulmonary consult w/ SN>        30 y/o gentleman, referred by DrABlount for a pulmonary evaluation>>  He is here w/ his son, he is elderly/ frail/ chronically ill appearing; son indicates that they are here to get oxygen and a nebulizer for his Dad having been referred by DrBlount for [place] to get these for him...  Patient is c/o cough for yrs, heavy mucus/ thick phlegm & "a blockage" in his upper chest that makes it difficult to expectorate the phlegm; he denies hemoptysis; notes chronic SOB/ DOE w/ min activity including ADLs for as long as he can remember, no CP, no f/c/s... He is an ex-smoker having smoked for 50+yrs but only up to 1/2ppd he says and quit around age 89 (20+yrs ago);  He denies any hx of lung diseases- states w/o bronchitis, pneumonia, Tb or exposure, asthma, etc... They do not recall if he has ever had vaccinations for the flu, Pneumovax, etc...       He went to the ER 04/03/15 & was [facility] by [facility] for 1day>  Dx w/ a COPD exacerbation & son says that's the 1st they heard about COPD; treated w/ Levaquin, Solumedrol=> Pred, Oxygen, Nebulizer treatments which helped...      Review in EPIC shows 8 recurrent ER visits over the last 2 yrs... Office visit from DrBlount dated 04/10/15 is reviewed> on Symbicort 2sp daily, Ventolin 2sp every 4H, off antibiotics; diagnosis was COPD exac w/ wheezing, no change in therapy...  EXAM shows Afeb, VSS, O2sat=93% on RA;  HEENT- edent, mallampati1, weak voice;  Chest- decr BS at bases w/ scat bilat rhonchi & end-exp wheezing, no rales, no consolid;  Heart- RR gr1/6 SEM w/o r/g;  Abd- soft, neg;  Ext- w/o c/c/e...   CT Angio Chest 03/2010 showed neg for PE, ectatic asc Aorta ~4cm, coronary art calcif, COPD, mild atx & pleural thickening  ABGs on O2 in ER 04/03/15 showed pH=7.36, pCO2=53.6, pO2=253  CXR 03/2015 showed norm heart size, Ao  elong & calcif, COPD/emphysema w/ incr interstitial markings, NAD.Marland KitchenMarland Kitchen  EKG 04/03/15 showed NSR, rate77, PVCs noted, NSSTTWA...  LABS 6/16 in hosp showed> Chems- ok x BS=115-170, Cr=0.9-1.1;  CBC- wnl;  BNP=72  Ambulatory Oxygen Saturation test 04/26/15>  O2sat=97% on RA at rest w/ pulse=82/min;  He ambulated 1 lap in office w/ nadir O2sat=88% w/ HR=92/min...    IMP/PLAN>>  Jet is 81 y/o and chronically ill appearing, very frail/ weak, with multisystem disease;  He has severe COPD/ emphysema and was unable to cooperate sufficiently to get meaningful numbers from a PFT;  He has evid of CO2 retention on ABGs during recent [facility];  He qualifies for Home O2- 1L/min at rest & 2L/min w/ exercise/activity;  We will also arrange for a home NEBULIZER w/ DUONEB Qid;  Finally we decided to give him a trial of PREDNISONE 20mg  daily (Qam) over the next month and OTC MUCINEX 600mg  QID w/ fluids to help w/ the thick phlegm... I have asked him to return in 28mo for reassessment.   ~  June 05, 2015:  28mo ROV w/ SN>       Jesus Carey returns after our last visit where we decided to give him a trial of Pred20/d, Home O2 at 2L/min exercise & 1L/min rest, and a home NEBULIZER w/ Duoneb Qid.Marland KitchenMarland Kitchen  Pt & son report that he is improved, doing better overall, feeling better, less SOB, and he wants to mow!  Weight is up 2# to 95# today w/ BMI~15;  We discussed the need for increased nutritional supplements (Ensure, Boost, etc);  We reviewed the following medical problems during today's office visit >>     67 y/o, markedly debilitated, malnourished, adult FTT>  Weight is up 2# to 95# today w/ BMI~15; rec to increase nutritional supplements...    Severe COPD/ emphysema>  He was unable to perform PFTs, baseline CXR w/ COPD/Emphysema changes; on Pred20/d x30mo, Home O2, NEBS w/ Duoneb Qid & improved; we will decr the Pred to /d...    Hx hypercarbic & hypoxemic respiratory failure>  On meds as listed + home O2;  Continue same...    HBP>  on Amlod10 per DrBlount; BP=110/70 and he denies CP, palpit, ch in SOB, edema; he may be able to decr the Amlod to /d...    Atherosclerotic calcif in coronary arteries seen Jun2011 CT Chest>  He is 81 y/o & pretty sedentary, denies CP, angina, etc...    Atherosclerotic peripheral vascular dis w/ dilated (4cm) tortuous Ao seen on prev films>  Aware- rec to take ASA /d...     Hypercholesterolemia>  On Cres10; labs followed by DrBlount...    BPH w/ BOO>  On Flomax0.4 & voiding is satis he says...    Glaucoma>  On eye drops & followed by Ophthalmology... EXAM shows Afeb, VSS, O2sat=95% on RA;  Wt=95#, BMI~15;  HEENT- edent, mallampati1, weak voice;  Chest- decr BS at bases but clear w/o w/r/r;  Heart- RR gr1/6 SEM w/o r/g;  Abd- soft, neg;  Ext- w/o c/c/e. IMP/PLAN>>  Jesus Carey is somewhat improved;  We decided to decr the Pred to /d, continue the NEBS Qid regularly & asked him to incr the nutritional supplements and his exercise program!    Past Medical History  Diagnosis Date  . Hypertension >> on Amlod10    Hyperlipidemia >> on Crestor10     ASPVD w/ dilated aneurysmal aorta on ASA81   . Glaucoma >> on 3 eye drops per ophthalmology   . Emphysema lung >> on Symbicort, Ventolin   . COPD (chronic obstructive pulmonary disease) w/ hypercarbic resp failure   . CVA (cerebral infarction) >> on ASA81    BPH >> on Flomax0.4     No past surgical history on file.   Outpatient Encounter Prescriptions as of 06/05/2015  Medication Sig  . amLODipine (NORVASC) 10 MG tablet Take 10 mg by mouth daily.  Marland Kitchen aspirin 81 MG tablet Take 81 mg by mouth daily.  . brimonidine (ALPHAGAN) 0.2 % ophthalmic solution Place 1 drop into the right eye 3 (three) times daily.   . brinzolamide (AZOPT) 1 % ophthalmic suspension Place 1 drop into both eyes 3 (three) times daily.   Marland Kitchen ipratropium-albuterol (DUONEB) 0.5-2.5 (3) MG/3ML SOLN Take 3 mLs by nebulization 4 (four) times daily.  . rosuvastatin (CRESTOR) 10 MG  tablet Take 10 mg by mouth daily.  . tamsulosin (FLOMAX) 0.4 MG CAPS capsule Take 1 capsule (0.4 mg total) by mouth daily.  . travoprost, benzalkonium, (TRAVATAN) 0.004 % ophthalmic solution Place 1 drop into both eyes at bedtime.   . VENTOLIN HFA 108 (90 BASE) MCG/ACT inhaler TAKE 2 PUFFS EVERY 4 HOURS AS NEEDED FOR WHEEZING OR SHORTNESS OF BREATH  . predniSONE (DELTASONE) 10 MG tablet Take 10 mg by mouth daily with breakfast.   No facility-administered encounter medications  on file as of 06/05/2015.    No Known Allergies   No family history on file.   Social History   Social History  . Marital Status: Widowed    Spouse Name: N/A  . Number of Children: N/A  . Years of Education: N/A   Occupational History  . Not on file.   Social History Main Topics  . Smoking status: Former Smoker -- 0.50 packs/day for 20 years    Types: Cigarettes    Quit date: 04/26/1995  . Smokeless tobacco: Never Used  . Alcohol Use: No  . Drug Use: No  . Sexual Activity: Not Currently   Other Topics Concern  . Not on file   Social History Narrative    Current Medications, Allergies, Past Medical History, Past Surgical History, Family History, and Social History were reviewed in [employer] record.   Review of Systems            All symptoms NEG except where BOLDED >>  Constitutional:  F/C/S, fatigue, anorexia, unexpected weight change. HEENT:  HA, visual changes, hearing loss, earache, nasal symptoms, sore throat, mouth sores, hoarseness. Resp:  cough, sputum, hemoptysis; SOB, tightness, wheezing. Cardio:  CP, palpit, DOE, orthopnea, edema. GI:  N/V/D/C, blood in stool; reflux, abd pain, distention, gas. GU:  dysuria, freq, urgency, hematuria, flank pain, voiding difficulty. MS:  joint pain, swelling, tenderness, decr ROM; neck pain, back pain, etc. Neuro:  HA, tremors, seizures, dizziness, syncope, weakness, numbness, gait abn. Skin:  suspicious lesions or skin  rash. Heme:  adenopathy, bruising, bleeding. Psyche:  confusion, agitation, sleep disturbance, hallucinations, anxiety, depression suicidal.   Objective:   Physical Exam      Vital Signs:  Reviewed...  General:  WD, thin, 81 y/o BM chronically ill appearing; alert, pleasant & cooperative... HEENT:  [place]/AT; Conjunctiva- pink, Sclera- nonicteric, EOM-wnl, PERRLA, EACs-clear, TMs-wnl; NOSE-clear; THROAT-clear & wnl. Neck:  Supple w/ fair ROM; no JVD; normal carotid impulses w/o bruits; no thyromegaly or nodules palpated; no lymphadenopathy. Chest:  decr BS, mild end-exp wheezing & scat rhonchi, no rales or signs of consolidation... Heart:  Regular Rhythm; gr 1/6 SEM w/o rubs or gallops detected. Abdomen:  Soft & nontender- no guarding or rebound; normal bowel sounds; no organomegaly or masses palpated. Ext:  decrROM; +deformities/ arthritic changes; no varicose veins, +enous insuffic, no edema;  Pulses intact w/o bruits. Neuro:  No focal neuro deficits; abn gait & balance... Derm:  No lesions noted; no rash etc. Lymph:  No cervical, supraclavicular, axillary, or inguinal adenopathy palpated.   Assessment:      IMP >>     30 y/o, markedly debilitated, malnourished, adult FTT>  Weight is up 2# to 95# today w/ BMI~15; rec to increase nutritional supplements...    Severe COPD/ emphysema>  He was unable to perform PFTs, baseline CXR w/ COPD/Emphysema changes; on Pred20/d x75mo, Home O2, NEBS w/ Duoneb Qid & improved; we will decr the Pred to 10mg /d...    Hx hypercarbic & hypoxemic respiratory failure>  On meds as listed + home O2;  Continue same...    HBP> on Amlod10 per DrBlount; BP=110/70 and he denies CP, palpit, ch in SOB, edema; he may be able to decr the Amlod to 5mg /d...    Atherosclerotic calcif in coronary arteries seen Jun2011 CT Chest>  He is 80 y/o & pretty sedentary, denies CP, angina, etc...    Atherosclerotic peripheral vascular dis w/ dilated (4cm) tortuous Ao seen on prev films>   Aware- rec  to take ASA 81mg /d...     Hypercholesterolemia>  On Cres10; labs followed by DrBlount...    BPH w/ BOO>  On Flomax0.4 & voiding is satis he says...    Glaucoma>  On eye drops & followed by Ophthalmology...  PLAN >>  7/14>  Zylen is 81 y/o and chronically ill appearing, very frail/ weak, with multisystem disease;  He has severe COPD/ emphysema and was unable to cooperate sufficiently to get meaningful numbers from a PFT;  He has evid of CO2 retention on ABGs during recent [facility];  He qualifies for Home O2- 1L/min at rest & 2L/min w/ exercise/activity;  We will also arrange for a home NEBULIZER w/ DUONEB Qid;  Finally we decided to give him a trial of PREDNISONE 20mg  daily (Qam) over the next month and OTC MUCINEX 600mg  QID w/ fluids to help w/ the thick phlegm... I have asked him to return in 49mo for reassessment. 8/23>  Jesus Carey is somewhat improved;  We decided to decr the Pred to 10mg /d, continue the NEBS Qid regularly & asked him to incr the nutritional supplements and his exercise program!     Plan:     Patient's Medications  New Prescriptions   PREDNISONE (DELTASONE) 10 MG TABLET    Take 1 tablet (10 mg total) by mouth daily with breakfast.  Previous Medications   AMLODIPINE (NORVASC) 10 MG TABLET    Take 10 mg by mouth daily.   ASPIRIN 81 MG TABLET    Take 81 mg by mouth daily.   BRIMONIDINE (ALPHAGAN) 0.2 % OPHTHALMIC SOLUTION    Place 1 drop into the right eye 3 (three) times daily.    BRINZOLAMIDE (AZOPT) 1 % OPHTHALMIC SUSPENSION    Place 1 drop into both eyes 3 (three) times daily.    IPRATROPIUM-ALBUTEROL (DUONEB) 0.5-2.5 (3) MG/3ML SOLN    Take 3 mLs by nebulization 4 (four) times daily.   PREDNISONE (DELTASONE) 10 MG TABLET    Take 10 mg by mouth daily with breakfast.   ROSUVASTATIN (CRESTOR) 10 MG TABLET    Take 10 mg by mouth daily.   TAMSULOSIN (FLOMAX) 0.4 MG CAPS CAPSULE    Take 1 capsule (0.4 mg total) by mouth daily.   TRAVOPROST, BENZALKONIUM, (TRAVATAN) 0.004  % OPHTHALMIC SOLUTION    Place 1 drop into both eyes at bedtime.    VENTOLIN HFA 108 (90 BASE) MCG/ACT INHALER    TAKE 2 PUFFS EVERY 4 HOURS AS NEEDED FOR WHEEZING OR SHORTNESS OF BREATH  Modified Medications   No medications on file  Discontinued Medications   No medications on file

## 2015-06-05 NOTE — Patient Instructions (Addendum)
Today we updated your med list in our EPIC system...    Continue your current medications the same...  We decided to continue the PREDNISONE Rx at  - one tab daily til return visit...  contuinue the breathing treatments etc...  I would recommend a nutritional supplement like ENSURE< SUSTACAL< BOOST- one can twice daily between meals...  Call for any questions...  Let's plan a follow up visit in 61mo, sooner if needed for problems.Marland KitchenMarland Kitchen

## 2015-07-26 ENCOUNTER — Encounter (HOSPITAL_COMMUNITY): Payer: Self-pay | Admitting: *Deleted

## 2015-07-26 ENCOUNTER — Emergency Department (HOSPITAL_COMMUNITY): Payer: Medicare Other

## 2015-07-26 ENCOUNTER — Emergency Department (HOSPITAL_COMMUNITY)
Admission: EM | Admit: 2015-07-26 | Discharge: 2015-07-26 | Disposition: A | Discharge status: Home / Self Care | Payer: Medicare Other | Attending: Emergency Medicine | Admitting: Emergency Medicine

## 2015-07-26 DIAGNOSIS — Z87891 Personal history of nicotine dependence: Secondary | ICD-10-CM | POA: Insufficient documentation

## 2015-07-26 DIAGNOSIS — Z7982 Long term (current) use of aspirin: Secondary | ICD-10-CM | POA: Insufficient documentation

## 2015-07-26 DIAGNOSIS — N400 Enlarged prostate without lower urinary tract symptoms: Secondary | ICD-10-CM | POA: Diagnosis not present

## 2015-07-26 DIAGNOSIS — Z79899 Other long term (current) drug therapy: Secondary | ICD-10-CM | POA: Insufficient documentation

## 2015-07-26 DIAGNOSIS — I10 Essential (primary) hypertension: Secondary | ICD-10-CM | POA: Insufficient documentation

## 2015-07-26 DIAGNOSIS — Z8719 Personal history of other diseases of the digestive system: Secondary | ICD-10-CM | POA: Insufficient documentation

## 2015-07-26 DIAGNOSIS — J441 Chronic obstructive pulmonary disease with (acute) exacerbation: Secondary | ICD-10-CM | POA: Diagnosis not present

## 2015-07-26 DIAGNOSIS — E785 Hyperlipidemia, unspecified: Secondary | ICD-10-CM | POA: Diagnosis not present

## 2015-07-26 DIAGNOSIS — Z8673 Personal history of transient ischemic attack (TIA), and cerebral infarction without residual deficits: Secondary | ICD-10-CM | POA: Insufficient documentation

## 2015-07-26 DIAGNOSIS — Z7952 Long term (current) use of systemic steroids: Secondary | ICD-10-CM | POA: Diagnosis not present

## 2015-07-26 DIAGNOSIS — R339 Retention of urine, unspecified: Secondary | ICD-10-CM | POA: Diagnosis present

## 2015-07-26 DIAGNOSIS — H409 Unspecified glaucoma: Secondary | ICD-10-CM | POA: Diagnosis not present

## 2015-07-26 DIAGNOSIS — N390 Urinary tract infection, site not specified: Secondary | ICD-10-CM | POA: Diagnosis not present

## 2015-07-26 LAB — CBC WITH DIFFERENTIAL/PLATELET
Basophils Absolute: 0 10*3/uL (ref 0.0–0.1)
Basophils Relative: 0 %
EOS PCT: 0 %
Eosinophils Absolute: 0 10*3/uL (ref 0.0–0.7)
HEMATOCRIT: 42.2 % (ref 39.0–52.0)
Hemoglobin: 13.4 g/dL (ref 13.0–17.0)
LYMPHS PCT: 6 %
Lymphs Abs: 0.7 10*3/uL (ref 0.7–4.0)
MCH: 28.1 pg (ref 26.0–34.0)
MCHC: 31.8 g/dL (ref 30.0–36.0)
MCV: 88.5 fL (ref 78.0–100.0)
MONO ABS: 0.8 10*3/uL (ref 0.1–1.0)
Monocytes Relative: 7 %
Neutro Abs: 10.7 10*3/uL — ABNORMAL HIGH (ref 1.7–7.7)
Neutrophils Relative %: 87 %
PLATELETS: 147 10*3/uL — AB (ref 150–400)
RBC: 4.77 MIL/uL (ref 4.22–5.81)
RDW: 14.9 % (ref 11.5–15.5)
WBC: 12.2 10*3/uL — ABNORMAL HIGH (ref 4.0–10.5)

## 2015-07-26 LAB — URINALYSIS, ROUTINE W REFLEX MICROSCOPIC
BILIRUBIN URINE: NEGATIVE
Glucose, UA: NEGATIVE mg/dL
Ketones, ur: NEGATIVE mg/dL
NITRITE: NEGATIVE
PH: 8.5 — AB (ref 5.0–8.0)
Protein, ur: 100 mg/dL — AB
SPECIFIC GRAVITY, URINE: 1.015 (ref 1.005–1.030)
Urobilinogen, UA: 0.2 mg/dL (ref 0.0–1.0)

## 2015-07-26 LAB — BASIC METABOLIC PANEL
Anion gap: 9 (ref 5–15)
BUN: 35 mg/dL — AB (ref 6–20)
CO2: 23 mmol/L (ref 22–32)
Calcium: 8.9 mg/dL (ref 8.9–10.3)
Chloride: 106 mmol/L (ref 101–111)
Creatinine, Ser: 1.42 mg/dL — ABNORMAL HIGH (ref 0.61–1.24)
GFR calc Af Amer: 48 mL/min — ABNORMAL LOW (ref >60)
GFR, EST NON AFRICAN AMERICAN: 41 mL/min — AB (ref >60)
GLUCOSE: 150 mg/dL — AB (ref 65–99)
POTASSIUM: 4.3 mmol/L (ref 3.5–5.1)
Sodium: 138 mmol/L (ref 135–145)

## 2015-07-26 LAB — URINE MICROSCOPIC-ADD ON

## 2015-07-26 LAB — I-STAT CG4 LACTIC ACID, ED: Lactic Acid, Venous: 2.14 mmol/L (ref 0.5–2.0)

## 2015-07-26 LAB — TROPONIN I: Troponin I: 0.03 ng/mL (ref <0.031)

## 2015-07-26 MED ORDER — SODIUM CHLORIDE 0.9 % IV BOLUS (SEPSIS)
1000.0000 mL | Freq: Once | INTRAVENOUS | Status: AC
  Administered 2015-07-26: 1000 mL via INTRAVENOUS

## 2015-07-26 MED ORDER — CEPHALEXIN 500 MG PO CAPS: 500.0000 mg | ORAL_CAPSULE | Freq: Two times a day (BID) | ORAL | Status: DC

## 2015-07-26 MED ORDER — DEXTROSE 5 % IV SOLN
1.0000 g | Freq: Once | INTRAVENOUS | Status: AC
  Administered 2015-07-26: 1 g via INTRAVENOUS
  Filled 2015-07-26: qty 10

## 2015-07-26 MED ORDER — IPRATROPIUM BROMIDE 0.02 % IN SOLN
0.5000 mg | Freq: Once | RESPIRATORY_TRACT | Status: AC
  Administered 2015-07-26: 0.5 mg via RESPIRATORY_TRACT
  Filled 2015-07-26: qty 2.5

## 2015-07-26 MED ORDER — ALBUTEROL SULFATE (2.5 MG/3ML) 0.083% IN NEBU
5.0000 mg | INHALATION_SOLUTION | RESPIRATORY_TRACT | Status: DC
  Administered 2015-07-26: 5 mg via RESPIRATORY_TRACT
  Filled 2015-07-26 (×2): qty 6

## 2015-07-26 NOTE — Discharge Instructions (Signed)
We saw you in the ER for the urinary retention and pain. All the results in the ER are normal, labs and imaging. Urine is infected. Antibiotics given.  See your doctor in 1 week time.  Please return to the ER if your symptoms worsen; you have increased pain, fevers, chills, inability to keep any medications down, confusion. Otherwise see the outpatient doctor as requested.   Urinary Tract Infection Urinary tract infections (UTIs) can develop anywhere along your urinary tract. Your urinary tract is your body's drainage system for removing wastes and extra water. Your urinary tract includes two kidneys, two ureters, a bladder, and a urethra. Your kidneys are a pair of bean-shaped organs. Each kidney is about the size of your fist. They are located below your ribs, one on each side of your spine. CAUSES Infections are caused by microbes, which are microscopic organisms, including fungi, viruses, and bacteria. These organisms are so small that they can only be seen through a microscope. Bacteria are the microbes that most commonly cause UTIs. SYMPTOMS  Symptoms of UTIs may vary by age and gender of the patient and by the location of the infection. Symptoms in young women typically include a frequent and intense urge to urinate and a painful, burning feeling in the bladder or urethra during urination. Older women and men are more likely to be tired, shaky, and weak and have muscle aches and abdominal pain. A fever may mean the infection is in your kidneys. Other symptoms of a kidney infection include pain in your back or sides below the ribs, nausea, and vomiting. DIAGNOSIS To diagnose a UTI, your caregiver will ask you about your symptoms. Your caregiver will also ask you to provide a urine sample. The urine sample will be tested for bacteria and white blood cells. White blood cells are made by your body to help fight infection. TREATMENT  Typically, UTIs can be treated with medication. Because most  UTIs are caused by a bacterial infection, they usually can be treated with the use of antibiotics. The choice of antibiotic and length of treatment depend on your symptoms and the type of bacteria causing your infection. HOME CARE INSTRUCTIONS  If you were prescribed antibiotics, take them exactly as your caregiver instructs you. Finish the medication even if you feel better after you have only taken some of the medication.  Drink enough water and fluids to keep your urine clear or pale yellow.  Avoid caffeine, tea, and carbonated beverages. They tend to irritate your bladder.  Empty your bladder often. Avoid holding urine for long periods of time.  Empty your bladder before and after sexual intercourse.  After a bowel movement, women should cleanse from front to back. Use each tissue only once. SEEK MEDICAL CARE IF:   You have back pain.  You develop a fever.  Your symptoms do not begin to resolve within 3 days. SEEK IMMEDIATE MEDICAL CARE IF:   You have severe back pain or lower abdominal pain.  You develop chills.  You have nausea or vomiting.  You have continued burning or discomfort with urination. MAKE SURE YOU:   Understand these instructions.  Will watch your condition.  Will get help right away if you are not doing well or get worse.   This information is not intended to replace advice given to you by your health care provider. Make sure you discuss any questions you have with your health care provider.   Document Released: 07/09/2005 Document Revised: 06/20/2015 Document Reviewed: 11/07/2011  Elsevier Interactive Patient Education ©2016 Elsevier Inc. ° °

## 2015-07-26 NOTE — ED Notes (Signed)
When family arrives they state that the breathing started on the way to the hospital; family reports that pt is on home O2 and they didn't have any oxygen for transport

## 2015-07-26 NOTE — ED Notes (Signed)
Transported to xray 

## 2015-07-26 NOTE — ED Notes (Signed)
PT states that there is no urine coming from his catheter; pt c/o bladder pressure and feels like he needs to urinate; pt c/o shortness of breath and increase work of breathing; pt is poor historian

## 2015-07-26 NOTE — ED Provider Notes (Signed)
CSN: [id number]     Arrival date & time 07/26/15  0435 History   First MD Initiated Contact with Patient 07/26/15 0507     Chief Complaint  Patient presents with  . Urinary Retention  . Shortness of Breath     (Consider location/radiation/quality/duration/timing/severity/associated sxs/prior Treatment) HPI Comments: 81 y.o. male 81 year old African-American male with history of COPD, chronic hypoxic respiratory failure on intermittent home O2, HTN, HLD, chronic constipation comes in with clogged catheter.  Pt typically has to change foley bag by night time, not the case today. He has some abd discomfort. No n/v/f/c. + urge to urinate. Hx of COPD. Having some dib with the pain. No new cough, wheezing.   ROS 10 Systems reviewed and are negative for acute change except as noted in the HPI.     Patient is a 81 y.o. male presenting with shortness of breath. The history is provided by the patient.  Shortness of Breath   Past Medical History  Diagnosis Date  . Hypertension   . Glaucoma   . Emphysema lung (HCC)   . COPD (chronic obstructive pulmonary disease) (HCC)   . CVA (cerebral infarction)   . BPH (benign prostatic hyperplasia)    History reviewed. No pertinent past surgical history. No family history on file. Social History  Substance Use Topics  . Smoking status: Former Smoker -- 0.50 packs/day for 20 years    Types: Cigarettes    Quit date: 04/26/1995  . Smokeless tobacco: Never Used  . Alcohol Use: No    Review of Systems  Respiratory: Positive for shortness of breath.       Allergies  Review of patient's allergies indicates no known allergies.  Home Medications   Prior to Admission medications   Medication Sig Start Date End Date Taking? Authorizing Provider  amLODipine (NORVASC) 10 MG tablet Take 10 mg by mouth daily.    Historical Provider, MD  aspirin 81 MG tablet Take 81 mg by mouth daily.    Historical Provider, MD  brimonidine (ALPHAGAN) 0.15 %  ophthalmic solution Place 1 drop into both eyes at bedtime. 07/23/15   Historical Provider, MD  brimonidine (ALPHAGAN) 0.2 % ophthalmic solution Place 1 drop into the right eye 3 (three) times daily.     Historical Provider, MD  brinzolamide (AZOPT) 1 % ophthalmic suspension Place 1 drop into both eyes 3 (three) times daily.     Historical Provider, MD  cephALEXin (KEFLEX) 500 MG capsule Take 1 capsule (500 mg total) by mouth 2 (two) times daily. 07/26/15   Derwood KaplanAnkit Copeland Neisen, MD  ipratropium-albuterol (DUONEB) 0.5-2.5 (3) MG/3ML SOLN Take 3 mLs by nebulization 4 (four) times daily. 05/28/15   Coralyn HellingVineet Sood, MD  predniSONE (DELTASONE) 10 MG tablet Take 10 mg by mouth daily with breakfast.    Historical Provider, MD  predniSONE (DELTASONE) 10 MG tablet Take 1 tablet (10 mg total) by mouth daily with breakfast. 06/05/15   Michele McalpineScott M Nadel, MD  rosuvastatin (CRESTOR) 10 MG tablet Take 10 mg by mouth daily.    Historical Provider, MD  tamsulosin (FLOMAX) 0.4 MG CAPS capsule Take 1 capsule (0.4 mg total) by mouth daily. 05/12/15   Christina P Rama, MD  travoprost, benzalkonium, (TRAVATAN) 0.004 % ophthalmic solution Place 1 drop into both eyes at bedtime.     Historical Provider, MD  VENTOLIN HFA 108 (90 BASE) MCG/ACT inhaler TAKE 2 PUFFS EVERY 4 HOURS AS NEEDED FOR WHEEZING OR SHORTNESS OF BREATH 04/19/15   Historical Provider, MD  BP 123/55 mmHg  Pulse 94  Temp(Src) 97.5 F (36.4 C) (Oral)  Resp 20  SpO2 99% Physical Exam  Constitutional: He is oriented to person, place, and time. He appears well-developed.  HENT:  Head: Normocephalic and atraumatic.  Eyes: Conjunctivae and EOM are normal. Pupils are equal, round, and reactive to light.  Neck: Normal range of motion. Neck supple.  Cardiovascular: Normal rate and regular rhythm.   Pulmonary/Chest: Effort normal and breath sounds normal. No respiratory distress. He has no wheezes.  Abdominal: Soft. Bowel sounds are normal. He exhibits no distension. There is  tenderness. There is no rebound and no guarding.  Lower quadrant tenderness  Neurological: He is alert and oriented to person, place, and time.  Skin: Skin is warm.  Nursing note and vitals reviewed.   ED Course  Procedures (including critical care time) Labs Review Labs Reviewed  CBC WITH DIFFERENTIAL/PLATELET - Abnormal; Notable for the following:    WBC 12.2 (*)    Platelets 147 (*)    Neutro Abs 10.7 (*)    All other components within normal limits  BASIC METABOLIC PANEL - Abnormal; Notable for the following:    Glucose, Bld 150 (*)    BUN 35 (*)    Creatinine, Ser 1.42 (*)    GFR calc non Af Amer 41 (*)    GFR calc Af Amer 48 (*)    All other components within normal limits  URINALYSIS, ROUTINE W REFLEX MICROSCOPIC (NOT AT [facility]) - Abnormal; Notable for the following:    APPearance TURBID (*)    pH 8.5 (*)    Hgb urine dipstick LARGE (*)    Protein, ur 100 (*)    Leukocytes, UA LARGE (*)    All other components within normal limits  URINE MICROSCOPIC-ADD ON - Abnormal; Notable for the following:    Bacteria, UA MANY (*)    All other components within normal limits  I-STAT CG4 LACTIC ACID, ED - Abnormal; Notable for the following:    Lactic Acid, Venous 2.14 (*)    All other components within normal limits  URINE CULTURE  TROPONIN I    Imaging Review No results found. I have personally reviewed and evaluated these images and lab results as part of my medical decision-making.   EKG Interpretation   Date/Time:  Thursday July 26 2015 04:54:10 EDT Ventricular Rate:  85 PR Interval:  151 QRS Duration: 95 QT Interval:  359 QTC Calculation: 427 R Axis:   49 Text Interpretation:  Sinus rhythm Multiple ventricular premature  complexes Right atrial enlargement Nonspecific repol abnormality, diffuse  leads inferior and lateral t wave inversions are new Confirmed by  Cable Fearn, MD, Keen Ewalt ([id number]) on 07/26/2015 5:09:29 AM      MDM   Final diagnoses:  UTI (lower  urinary tract infection)    Pt with some abd discomfort and difficulty voiding. Foley replaced. UA shows ? Infection. AAS is neg for free air or any signs of SBO. Pt will be given meds for UTI. VSS and WNL. Strict return precautions discussed.    Derwood Kaplan, MD 08/04/15 [phone]

## 2015-07-26 NOTE — ED Notes (Signed)
Pt complains of a clogged catheter, usually his leg bag is full att his time every morning and this morning it wasn't. His catheter is a month old.

## 2015-07-27 LAB — URINE CULTURE

## 2015-08-06 ENCOUNTER — Encounter: Payer: Self-pay | Admitting: Pulmonary Disease

## 2015-08-06 ENCOUNTER — Ambulatory Visit (INDEPENDENT_AMBULATORY_CARE_PROVIDER_SITE_OTHER): Payer: Medicare Other | Admitting: Pulmonary Disease

## 2015-08-06 VITALS — BP 118/60 | HR 103 | Temp 98.0°F | Wt 92.4 lb

## 2015-08-06 DIAGNOSIS — E46 Unspecified protein-calorie malnutrition: Secondary | ICD-10-CM

## 2015-08-06 DIAGNOSIS — J418 Mixed simple and mucopurulent chronic bronchitis: Secondary | ICD-10-CM

## 2015-08-06 DIAGNOSIS — J9611 Chronic respiratory failure with hypoxia: Secondary | ICD-10-CM | POA: Diagnosis not present

## 2015-08-06 DIAGNOSIS — R627 Adult failure to thrive: Secondary | ICD-10-CM | POA: Diagnosis not present

## 2015-08-06 NOTE — Patient Instructions (Signed)
Today we updated your med list in our EPIC system...    Continue your current medications the same...  Continue the NEBULIZER 4 times daily...  Continue the PREDNISONE 10mg  each AM...  Add-in a nutritional supplement like Ensure/ Sustacal/ Boost/ or Carnation instant breakfast...    Try to take one of these 3 times daily (between meals and an hour or so before bedtime...  Call for any questions...  Let's plan a follow up visit in 108mo, sooner if needed for problems.Marland Kitchen..Marland Kitchen

## 2015-08-06 NOTE — Progress Notes (Signed)
Subjective:     Patient ID: Jesus Carey, male   DOB: 1920-05-08, 81 y.o.   MRN: [id number]  HPI ~  April 26, 2015:  Initial pulmonary consult w/ SN>        43 y/o gentleman, referred by DrABlount for a pulmonary evaluation>>  He is here w/ his son, he is elderly/ frail/ chronically ill appearing; son indicates that they are here to get oxygen and a nebulizer for his Dad having been referred by DrBlount for [place] to get these for him...  Patient is c/o cough for yrs, heavy mucus/ thick phlegm & "a blockage" in his upper chest that makes it difficult to expectorate the phlegm; he denies hemoptysis; notes chronic SOB/ DOE w/ min activity including ADLs for as long as he can remember, no CP, no f/c/s... He is an ex-smoker having smoked for 50+yrs but only up to 1/2ppd he says and quit around age 27 (20+yrs ago);  He denies any hx of lung diseases- states w/o bronchitis, pneumonia, Tb or exposure, asthma, etc... They do not recall if he has ever had vaccinations for the flu, Pneumovax, etc...       He went to the ER 04/03/15 & was [facility] by [facility] for 1day>  Dx w/ a COPD exacerbation & son says that's the 1st they heard about COPD; treated w/ Levaquin, Solumedrol=> Pred, Oxygen, Nebulizer treatments which helped...      Review in EPIC shows 8 recurrent ER visits over the last 2 yrs... Office visit from DrBlount dated 04/10/15 is reviewed> on Symbicort 2sp daily, Ventolin 2sp every 4H, off antibiotics; diagnosis was COPD exac w/ wheezing, no change in therapy...  EXAM shows Afeb, VSS, O2sat=93% on RA;  HEENT- edent, mallampati1, weak voice;  Chest- decr BS at bases w/ scat bilat rhonchi & end-exp wheezing, no rales, no consolid;  Heart- RR gr1/6 SEM w/o r/g;  Abd- soft, neg;  Ext- w/o c/c/e...   CT Angio Chest 03/2010 showed neg for PE, ectatic asc Aorta ~4cm, coronary art calcif, COPD, mild atx & pleural thickening  ABGs on O2 in ER 04/03/15 showed pH=7.36, pCO2=53.6, pO2=253  CXR 03/2015 showed norm heart size, Ao  elong & calcif, COPD/emphysema w/ incr interstitial markings, NAD.Marland KitchenMarland Kitchen  EKG 04/03/15 showed NSR, rate77, PVCs noted, NSSTTWA...  LABS 6/16 in hosp showed> Chems- ok x BS=115-170, Cr=0.9-1.1;  CBC- wnl;  BNP=72  Ambulatory Oxygen Saturation test 04/26/15>  O2sat=97% on RA at rest w/ pulse=82/min;  He ambulated 1 lap in office w/ nadir O2sat=88% w/ HR=92/min...    IMP/PLAN>>  Jesus Carey is 81 y/o and chronically ill appearing, very frail/ weak, with multisystem disease;  He has severe COPD/ emphysema and was unable to cooperate sufficiently to get meaningful numbers from a PFT;  He has evid of CO2 retention on ABGs during recent [facility];  He qualifies for Home O2- 1L/min at rest & 2L/min w/ exercise/activity;  We will also arrange for a home NEBULIZER w/ DUONEB Qid;  Finally we decided to give him a trial of PREDNISONE 20mg  daily (Qam) over the next month and OTC MUCINEX 600mg  QID w/ fluids to help w/ the thick phlegm... I have asked him to return in 14mo for reassessment.   ~  June 05, 2015:  14mo ROV w/ SN>       Jesus Carey returns after our last visit where we decided to give him a trial of Pred20/d, Home O2 at 2L/min exercise & 1L/min rest, and a home NEBULIZER w/ Duoneb Qid.Marland KitchenMarland Kitchen  Pt & son report that he is improved, doing better overall, feeling better, less SOB, and he wants to mow!  Weight is up 2# to 95# today w/ BMI~15;  We discussed the need for increased nutritional supplements (Ensure, Boost, etc);  We reviewed the following medical problems during today's office visit >>     64 y/o, markedly debilitated, malnourished, adult FTT>  Weight is up 2# to 95# today w/ BMI~15; rec to increase nutritional supplements...    Severe COPD/ emphysema>  He was unable to perform PFTs, baseline CXR w/ COPD/Emphysema changes; on Pred20/d x519mo, Home O2, NEBS w/ Duoneb Qid & improved; we will decr the Pred to 10mg /d...    Hx hypercarbic & hypoxemic respiratory failure>  On meds as listed + home O2;  Continue same...    HBP>  on Amlod10 per DrBlount; BP=110/70 and he denies CP, palpit, ch in SOB, edema; he may be able to decr the Amlod to 5mg /d...    Atherosclerotic calcif in coronary arteries seen Jun2011 CT Chest>  He is 81 y/o & pretty sedentary, denies CP, angina, etc...    Atherosclerotic peripheral vascular dis w/ dilated (4cm) tortuous Ao seen on prev films>  Aware- rec to take ASA 81mg /d...     Hypercholesterolemia>  On Cres10; labs followed by DrBlount...    BPH w/ BOO>  On Flomax0.4 & voiding is satis he says...    Glaucoma>  On eye drops & followed by Ophthalmology... EXAM shows Afeb, VSS, O2sat=95% on RA;  Wt=95#, BMI~15;  HEENT- edent, mallampati1, weak voice;  Chest- decr BS at bases but clear w/o w/r/r;  Heart- RR gr1/6 SEM w/o r/g;  Abd- soft, neg;  Ext- w/o c/c/e. IMP/PLAN>>  Jesus Carey is somewhat improved;  We decided to decr the Pred to 10mg /d, continue the NEBS Qid regularly & asked him to incr the nutritional supplements and his exercise program!  ~  August 06, 2015:  19mo ROV & pt/son report that his breathing is improved/ stable on current regimen on Home O2 w/ 2L/min w/ exercise & 1L/min at rest, Pred10mg /d, NEBS w/ Duoneb Qid, and a Proair rescue inhaler for prn use;  Epic records indicate that he has had some urinary symptoms- he saw DrNesi w/ foley placed, pt didn't follow up & foley got infected/ UTI/ urinary retention=> went to the ER 07/26/15 & Epic reviewed- mult species on C&S, WBC=12K, Cr=1.4;  He was given IV Rocephin & oral Keflex, improved w/ f/u appt DrNesi pending... See problem list above... EXAM shows Afeb, VSS, O2sat=94% on RA today;  Wt=92# today;  HEENT- neg, mallampati1;  Chest- decr BS bilat, clear w/o w/r/r;  Heart- RR gr1/6SEM w/o r/g;  Abd- thin soft nontender, foley w/ leg bag;  Ext- neg w/o c/c/e...   CXR 07/1315 via ER showed norm heart size, chr changes of COPD w/ mild incr interstitial markings w/o opacities/ nodules/ fluid/ etc... PLAN>>  [name]'s breathing is improved and  stable now on his O2, Pred10, & NEBS-Qid; he is asked to continue same for now & we plan ROV in about 10mo; he is followed by [facility] for the urinary problems...    Past Medical History  Diagnosis Date  . Hypertension >> on Amlod10    Hyperlipidemia >> on Crestor10     ASPVD w/ dilated aneurysmal aorta on ASA81   . Glaucoma >> on 3 eye drops per ophthalmology   . Emphysema lung >> on Symbicort, Ventolin   . COPD (chronic obstructive pulmonary disease) w/ hypercarbic  resp failure   . CVA (cerebral infarction) >> on ASA81    BPH >> on Flomax0.4     No past surgical history on file.   Outpatient Encounter Prescriptions as of 08/06/2015  Medication Sig  . amLODipine (NORVASC) 10 MG tablet Take 10 mg by mouth daily.  Marland Kitchen. aspirin 81 MG tablet Take 81 mg by mouth daily.  . brimonidine (ALPHAGAN) 0.2 % ophthalmic solution Place 1 drop into the right eye 3 (three) times daily.   . brinzolamide (AZOPT) 1 % ophthalmic suspension Place 1 drop into both eyes 3 (three) times daily.   . cephALEXin (KEFLEX) 500 MG capsule Take 1 capsule (500 mg total) by mouth 2 (two) times daily.  Marland Kitchen. ipratropium-albuterol (DUONEB) 0.5-2.5 (3) MG/3ML SOLN Take 3 mLs by nebulization 4 (four) times daily.  . predniSONE (DELTASONE) 10 MG tablet Take 10 mg by mouth daily with breakfast.  . rosuvastatin (CRESTOR) 10 MG tablet Take 10 mg by mouth daily.  . tamsulosin (FLOMAX) 0.4 MG CAPS capsule Take 1 capsule (0.4 mg total) by mouth daily.  . travoprost, benzalkonium, (TRAVATAN) 0.004 % ophthalmic solution Place 1 drop into both eyes at bedtime.   . VENTOLIN HFA 108 (90 BASE) MCG/ACT inhaler TAKE 2 PUFFS EVERY 4 HOURS AS NEEDED FOR WHEEZING OR SHORTNESS OF BREATH  . [DISCONTINUED] brimonidine (ALPHAGAN) 0.15 % ophthalmic solution Place 1 drop into both eyes at bedtime.  . [DISCONTINUED] predniSONE (DELTASONE) 10 MG tablet Take 1 tablet (10 mg total) by mouth daily with breakfast.   No facility-administered encounter  medications on file as of 08/06/2015.    No Known Allergies   No family history on file.   Social History   Social History  . Marital Status: Widowed    Spouse Name: N/A  . Number of Children: N/A  . Years of Education: N/A   Occupational History  . Not on file.   Social History Main Topics  . Smoking status: Former Smoker -- 0.50 packs/day for 20 years    Types: Cigarettes    Quit date: 04/26/1995  . Smokeless tobacco: Never Used  . Alcohol Use: No  . Drug Use: No  . Sexual Activity: Not Currently   Other Topics Concern  . Not on file   Social History Narrative    Current Medications, Allergies, Past Medical History, Past Surgical History, Family History, and Social History were reviewed in [facility] electronic medical record.   Review of Systems            All symptoms NEG except where BOLDED >>  Constitutional:  F/C/S, fatigue, anorexia, unexpected weight change. HEENT:  HA, visual changes, hearing loss, earache, nasal symptoms, sore throat, mouth sores, hoarseness. Resp:  Cough, min sputum, hemoptysis; SOB, tightness, wheezing. Cardio:  CP, palpit, DOE, orthopnea, edema. GI:  N/V/D/C, blood in stool; reflux, abd pain, distention, gas. GU:  dysuria, freq, urgency, hematuria, flank pain, voiding difficulty. MS:  joint pain, swelling, tenderness, decr ROM; neck pain, back pain, etc. Neuro:  HA, tremors, seizures, dizziness, syncope, weakness, numbness, gait abn. Skin:  suspicious lesions or skin rash. Heme:  adenopathy, bruising, bleeding. Psyche:  confusion, agitation, sleep disturbance, hallucinations, anxiety, depression suicidal.   Objective:   Physical Exam      Vital Signs:  Reviewed...  General:  WD, thin, 81 y/o BM chronically ill appearing; alert, pleasant & cooperative... HEENT:  [place]/AT; Conjunctiva- pink, Sclera- nonicteric, EOM-wnl, PERRLA, EACs-clear, TMs-wnl; NOSE-clear; THROAT-clear & wnl. Neck:  Supple w/ fair ROM; no  JVD; normal  carotid impulses w/o bruits; no thyromegaly or nodules palpated; no lymphadenopathy. Chest:  decr BS, but clear now w/o wheezing/ rales/ rhonchi/ or signs of consolidation... Heart:  Regular Rhythm; gr 1/6 SEM w/o rubs or gallops detected. Abdomen:  Soft & nontender- no guarding or rebound; normal bowel sounds; no organomegaly or masses palpated. Ext:  decrROM; +deformities/ arthritic changes; no varicose veins, +enous insuffic, no edema;  Pulses intact w/o bruits. Neuro:  No focal neuro deficits; abn gait & balance... Derm:  No lesions noted; no rash etc. Lymph:  No cervical, supraclavicular, axillary, or inguinal adenopathy palpated.   Assessment:      IMP >>     7 y/o, markedly debilitated, malnourished, adult FTT>  Weight is down 3# to 92# today w/ BMI~15; rec to increase nutritional supplements to Tid 7 incr intake at meals...    Severe COPD/ emphysema>  He was unable to perform PFTs, baseline CXR w/ COPD/Emphysema changes; on Home O2, Pred10, NEBS w/ Duoneb Qid & improved; rec to continue same.    Hx hypercarbic & hypoxemic respiratory failure>  On meds as listed + home O2;  Continue same...    HBP> on Amlod10 per DrBlount; BP=118/60 and he denies CP, palpit, ch in SOB, edema; he may be able to decr the Amlod to /d...    Atherosclerotic calcif in coronary arteries seen Jun2011 CT Chest>  He is 81 y/o & pretty sedentary, denies CP, angina, etc...    Atherosclerotic peripheral vascular dis w/ dilated (4cm) tortuous Ao seen on prev films>  Aware- rec to take ASA /d...     Hypercholesterolemia>  On Cres10; labs followed by DrBlount...    BPH w/ BOO>  On Flomax0.4 & he has chr foley managed by DrNesi...     Glaucoma>  On eye drops & followed by Ophthalmology...  PLAN >>  7/14>  Jahleel is 81 y/o and chronically ill appearing, very frail/ weak, with multisystem disease;  He has severe COPD/ emphysema and was unable to cooperate sufficiently to get meaningful numbers from a PFT;  He  has evid of CO2 retention on ABGs during recent [facility];  He qualifies for Home O2- 1L/min at rest & 2L/min w/ exercise/activity;  We will also arrange for a home NEBULIZER w/ DUONEB Qid;  Finally we decided to give him a trial of PREDNISONE  daily (Qam) over the next month and OTC MUCINEX  QID w/ fluids to help w/ the thick phlegm... I have asked him to return in 12mo for reassessment. 8/23>  Jesus Carey is somewhat improved;  We decided to decr the Pred to /d, continue the NEBS Qid regularly & asked him to incr the nutritional supplements and his exercise program! 10/24>  Damere's breathing is improved and stable now on his O2, Pred10, & NEBS-Qid; he is asked to continue same for now & we plan ROV in about 22mo; he is followed by [facility] for the urinary problems     Plan:     Patient's Medications  New Prescriptions   No medications on file  Previous Medications   AMLODIPINE (NORVASC) 10 MG TABLET    Take 10 mg by mouth daily.   ASPIRIN 81 MG TABLET    Take 81 mg by mouth daily.   BRIMONIDINE (ALPHAGAN) 0.2 % OPHTHALMIC SOLUTION    Place 1 drop into the right eye 3 (three) times daily.    BRINZOLAMIDE (AZOPT) 1 % OPHTHALMIC SUSPENSION    Place 1 drop into both eyes 3 (  three) times daily.    CEPHALEXIN (KEFLEX) 500 MG CAPSULE    Take 1 capsule (500 mg total) by mouth 2 (two) times daily.   IPRATROPIUM-ALBUTEROL (DUONEB) 0.5-2.5 (3) MG/3ML SOLN    Take 3 mLs by nebulization 4 (four) times daily.   PREDNISONE (DELTASONE) 10 MG TABLET    Take 10 mg by mouth daily with breakfast.   ROSUVASTATIN (CRESTOR) 10 MG TABLET    Take 10 mg by mouth daily.   TAMSULOSIN (FLOMAX) 0.4 MG CAPS CAPSULE    Take 1 capsule (0.4 mg total) by mouth daily.   TRAVOPROST, BENZALKONIUM, (TRAVATAN) 0.004 % OPHTHALMIC SOLUTION    Place 1 drop into both eyes at bedtime.    VENTOLIN HFA 108 (90 BASE) MCG/ACT INHALER    TAKE 2 PUFFS EVERY 4 HOURS AS NEEDED FOR WHEEZING OR SHORTNESS OF BREATH  Modified Medications   No  medications on file  Discontinued Medications   BRIMONIDINE (ALPHAGAN) 0.15 % OPHTHALMIC SOLUTION    Place 1 drop into both eyes at bedtime.   PREDNISONE (DELTASONE) 10 MG TABLET    Take 1 tablet (10 mg total) by mouth daily with breakfast.

## 2015-08-19 ENCOUNTER — Encounter (HOSPITAL_COMMUNITY): Payer: Self-pay | Admitting: Emergency Medicine

## 2015-08-19 ENCOUNTER — Emergency Department (HOSPITAL_COMMUNITY)
Admission: EM | Admit: 2015-08-19 | Discharge: 2015-08-19 | Disposition: A | Discharge status: Home / Self Care | Payer: Medicare Other | Attending: Emergency Medicine | Admitting: Emergency Medicine

## 2015-08-19 DIAGNOSIS — J449 Chronic obstructive pulmonary disease, unspecified: Secondary | ICD-10-CM | POA: Insufficient documentation

## 2015-08-19 DIAGNOSIS — Z7982 Long term (current) use of aspirin: Secondary | ICD-10-CM | POA: Diagnosis not present

## 2015-08-19 DIAGNOSIS — N390 Urinary tract infection, site not specified: Secondary | ICD-10-CM | POA: Diagnosis not present

## 2015-08-19 DIAGNOSIS — T83031A Leakage of indwelling urethral catheter, initial encounter: Secondary | ICD-10-CM | POA: Diagnosis present

## 2015-08-19 DIAGNOSIS — Z79899 Other long term (current) drug therapy: Secondary | ICD-10-CM | POA: Insufficient documentation

## 2015-08-19 DIAGNOSIS — I10 Essential (primary) hypertension: Secondary | ICD-10-CM | POA: Diagnosis not present

## 2015-08-19 DIAGNOSIS — Y658 Other specified misadventures during surgical and medical care: Secondary | ICD-10-CM | POA: Diagnosis not present

## 2015-08-19 DIAGNOSIS — H409 Unspecified glaucoma: Secondary | ICD-10-CM | POA: Diagnosis not present

## 2015-08-19 DIAGNOSIS — Z87891 Personal history of nicotine dependence: Secondary | ICD-10-CM | POA: Insufficient documentation

## 2015-08-19 DIAGNOSIS — Z7952 Long term (current) use of systemic steroids: Secondary | ICD-10-CM | POA: Insufficient documentation

## 2015-08-19 DIAGNOSIS — N400 Enlarged prostate without lower urinary tract symptoms: Secondary | ICD-10-CM | POA: Insufficient documentation

## 2015-08-19 DIAGNOSIS — Z8673 Personal history of transient ischemic attack (TIA), and cerebral infarction without residual deficits: Secondary | ICD-10-CM | POA: Diagnosis not present

## 2015-08-19 DIAGNOSIS — T839XXA Unspecified complication of genitourinary prosthetic device, implant and graft, initial encounter: Secondary | ICD-10-CM

## 2015-08-19 LAB — URINALYSIS, ROUTINE W REFLEX MICROSCOPIC
BILIRUBIN URINE: NEGATIVE
Glucose, UA: NEGATIVE mg/dL
KETONES UR: NEGATIVE mg/dL
Nitrite: POSITIVE — AB
PH: 8 (ref 5.0–8.0)
Protein, ur: NEGATIVE mg/dL
Specific Gravity, Urine: 1.007 (ref 1.005–1.030)
Urobilinogen, UA: 0.2 mg/dL (ref 0.0–1.0)

## 2015-08-19 LAB — I-STAT CHEM 8, ED
BUN: 17 mg/dL (ref 6–20)
CREATININE: 1 mg/dL (ref 0.61–1.24)
Calcium, Ion: 1.16 mmol/L (ref 1.13–1.30)
Chloride: 103 mmol/L (ref 101–111)
Glucose, Bld: 120 mg/dL — ABNORMAL HIGH (ref 65–99)
HEMATOCRIT: 45 % (ref 39.0–52.0)
Hemoglobin: 15.3 g/dL (ref 13.0–17.0)
Potassium: 3.8 mmol/L (ref 3.5–5.1)
SODIUM: 142 mmol/L (ref 135–145)
TCO2: 25 mmol/L (ref 0–100)

## 2015-08-19 LAB — URINE MICROSCOPIC-ADD ON

## 2015-08-19 MED ORDER — CEFTRIAXONE SODIUM 1 G IJ SOLR
1.0000 g | Freq: Once | INTRAMUSCULAR | Status: AC
Start: 2015-08-19 — End: 2015-08-19
  Administered 2015-08-19: 1 g via INTRAMUSCULAR
  Filled 2015-08-19: qty 10

## 2015-08-19 MED ORDER — ACETAMINOPHEN 500 MG PO TABS
1000.0000 mg | ORAL_TABLET | Freq: Once | ORAL | Status: DC
  Filled 2015-08-19: qty 2

## 2015-08-19 MED ORDER — LIDOCAINE HCL 1 % IJ SOLN
INTRAMUSCULAR | Status: AC
  Administered 2015-08-19: 2.1 mL
  Filled 2015-08-19: qty 20

## 2015-08-19 MED ORDER — CEPHALEXIN 500 MG PO CAPS: 500.0000 mg | ORAL_CAPSULE | Freq: Four times a day (QID) | ORAL | Status: DC

## 2015-08-19 NOTE — ED Provider Notes (Signed)
CSN: [id number]     Arrival date & time 08/19/15  0100 History   First MD Initiated Contact with Patient 08/19/15 0219     No chief complaint on file.    (Consider location/radiation/quality/duration/timing/severity/associated sxs/prior Treatment) The history is provided by the patient and a relative.  89 yom with chronic indwelling foley not changed out in several weeks comes in with decreased UOP since 9 pm.   Leakage around meatus.  No f/c/r.  No n/v/d.  No pain.  No associated sx all other ROS negative  Past Medical History  Diagnosis Date  . Hypertension   . Glaucoma   . Emphysema lung (HCC)   . COPD (chronic obstructive pulmonary disease) (HCC)   . CVA (cerebral infarction)   . BPH (benign prostatic hyperplasia)    History reviewed. No pertinent past surgical history. History reviewed. No pertinent family history. Social History  Substance Use Topics  . Smoking status: Former Smoker -- 0.50 packs/day for 20 years    Types: Cigarettes    Quit date: 04/26/1995  . Smokeless tobacco: Never Used  . Alcohol Use: No    Review of Systems  Constitutional: Negative for fever and chills.  Respiratory: Negative for shortness of breath.   Cardiovascular: Negative for chest pain.  Genitourinary: Positive for difficulty urinating. Negative for dysuria, frequency and hematuria.  All other systems reviewed and are negative.     Allergies  Review of patient's allergies indicates no known allergies.  Home Medications   Prior to Admission medications   Medication Sig Start Date End Date Taking? Authorizing Provider  amLODipine (NORVASC) 10 MG tablet Take 10 mg by mouth daily.   Yes Historical Provider, MD  aspirin EC 81 MG tablet Take 81 mg by mouth daily.   Yes Historical Provider, MD  brimonidine (ALPHAGAN) 0.2 % ophthalmic solution Place 1 drop into the right eye 3 (three) times daily.    Yes Historical Provider, MD  brinzolamide (AZOPT) 1 % ophthalmic suspension Place 1 drop  into both eyes 3 (three) times daily.    Yes Historical Provider, MD  ipratropium-albuterol (DUONEB) 0.5-2.5 (3) MG/3ML SOLN Take 3 mLs by nebulization 4 (four) times daily. 05/28/15  Yes Coralyn Helling, MD  predniSONE (DELTASONE) 10 MG tablet Take 10 mg by mouth daily with breakfast.   Yes Historical Provider, MD  rosuvastatin (CRESTOR) 10 MG tablet Take 10 mg by mouth daily.   Yes Historical Provider, MD  tamsulosin (FLOMAX) 0.4 MG CAPS capsule Take 1 capsule (0.4 mg total) by mouth daily. 05/12/15  Yes Christina P Rama, MD  travoprost, benzalkonium, (TRAVATAN) 0.004 % ophthalmic solution Place 1 drop into both eyes at bedtime.    Yes Historical Provider, MD  cephALEXin (KEFLEX) 500 MG capsule Take 1 capsule (500 mg total) by mouth 2 (two) times daily. Patient not taking: Reported on 08/19/2015 07/26/15   Derwood Kaplan, MD   BP 119/71 mmHg  Pulse 71  Resp 16  SpO2 96% Physical Exam  Constitutional: He is oriented to person, place, and time. He appears well-developed and well-nourished. No distress.  HENT:  Head: Normocephalic and atraumatic.  Mouth/Throat: Oropharynx is clear and moist.  Eyes: Conjunctivae are normal. Pupils are equal, round, and reactive to light.  Neck: Normal range of motion. Neck supple.  Pulmonary/Chest: No accessory muscle usage. No respiratory distress. He has decreased breath sounds. He has no wheezes. He has no rales.  Abdominal: Soft. Bowel sounds are normal. There is no tenderness. There is no rebound and  no guarding.  Musculoskeletal: Normal range of motion.  Neurological: He is alert and oriented to person, place, and time.  Skin: Skin is warm and dry.  Psychiatric: He has a normal mood and affect.    ED Course  Procedures (including critical care time) Labs Review Labs Reviewed  URINALYSIS, ROUTINE W REFLEX MICROSCOPIC (NOT AT [facility]) - Abnormal; Notable for the following:    APPearance CLOUDY (*)    Hgb urine dipstick LARGE (*)    Nitrite POSITIVE (*)     Leukocytes, UA LARGE (*)    All other components within normal limits  URINE MICROSCOPIC-ADD ON - Abnormal; Notable for the following:    Bacteria, UA MANY (*)    Crystals TRIPLE PHOSPHATE CRYSTALS (*)    All other components within normal limits  I-STAT CHEM 8, ED - Abnormal; Notable for the following:    Glucose, Bld 120 (*)    All other components within normal limits  URINE CULTURE    Imaging Review No results found. I have personally reviewed and evaluated these images and lab results as part of my medical decision-making.   EKG Interpretation None      MDM   Final diagnoses:  None    Foley changed, urine cultured.  ROcephin IM x1 and will start keflex x 7 days.  Follow up with urology Monday for ongoing care.  Strict return precautions for fever weakness vomiting, decreased UOP or any concerns.      Cy BlamerApril Monee Dembeck, MD 08/19/15 [phone]

## 2015-08-19 NOTE — Discharge Instructions (Signed)
Catheter-Associated Urinary Tract Infection FAQs  What is "catheter-associated urinary tract infection"?  A urinary tract infection (also called "UTI") is an infection in the urinary system, which includes the bladder (which stores the urine) and the kidneys (which filter the blood to make urine). Germs (for example, bacteria or yeasts) do not normally live in these areas; but if germs are introduced, an infection can occur.  If you have a urinary catheter, germs can travel along the catheter and cause an infection in your bladder or your kidney; in that case it is called a catheter-associated urinary tract infection (or "CA-UTI").   What is a urinary catheter?  A urinary catheter is a thin tube placed in the bladder to drain urine. Urine drains through the tube into a bag that collects the urine. A urinary catheter may be used:  · If you are not able to urinate on your own  · To measure the amount of urine that you make, for example, during intensive care  · During and after some types of surgery  · During some tests of the kidneys and bladder  People with urinary catheters have a much higher chance of getting a urinary tract infection than people who don't have a catheter.  How do I get a catheter-associated urinary tract infection (CA-UTI)?  If germs enter the urinary tract, they may cause an infection. Many of the germs that cause a catheter-associated urinary tract infection are common germs found in your intestines that do not usually cause an infection there. Germs can enter the urinary tract when the catheter is being put in or while the catheter remains in the bladder.   What are the symptoms of a urinary tract infection?  Some of the common symptoms of a urinary tract infection are:  · Burning or pain in the lower abdomen (that is, below the stomach)  · Fever  · Bloody urine may be a sign of infection, but is also caused by other problems  · Burning during urination or an increase in the frequency of  urination after the catheter is removed.  Sometimes people with catheter-associated urinary tract infections do not have these symptoms of infection.  Can catheter-associated urinary tract infections be treated?  Yes, most catheter-associated urinary tract infections can be treated with antibiotics and removal or change of the catheter. Your doctor will determine which antibiotic is best for you.   What are some of the things that hospitals are doing to prevent catheter-associated urinary tract infections?  To prevent urinary tract infections, doctors and nurses take the following actions.   Catheter insertion  · External catheters in men (these look like condoms and are placed over the penis rather than into the penis)  · Putting a temporary catheter in to drain the urine and removing it right away. This is called intermittent urethral catheterization.  Catheter care  What can I do to help prevent catheter-associated urinary tract infections if I have a catheter?  · Always clean your hands before and after doing catheter care.  · Always keep your urine bag below the level of your bladder.  · Do not tug or pull on the tubing.  · Do not twist or kink the catheter tubing.  · Ask your healthcare provider each day if you still need the catheter.  What do I need to do when I go home from the hospital?  · If you will be going home with a catheter, your doctor or nurse should explain everything   you need to know about taking care of the catheter. Make sure you understand how to care for it before you leave the hospital.  · If you develop any of the symptoms of a urinary tract infection, such as burning or pain in the lower abdomen, fever, or an increase in the frequency of urination, contact your doctor or nurse immediately.  · Before you go home, make sure you know who to contact if you have questions or problems after you get home.  If you have questions, please ask your doctor or nurse.  Developed and co-sponsored by The  Society for Healthcare Epidemiology of America (SHEA); Infectious Diseases Society of America (IDSA); American Hospital Association; Association for Professionals in Infection Control and Epidemiology (APIC); Centers for Disease Control and Prevention (CDC); and The Joint Commission.     This information is not intended to replace advice given to you by your health care provider. Make sure you discuss any questions you have with your health care provider.     Document Released: 06/23/2012 Document Revised: 02/13/2015 Document Reviewed: 12/13/2014  Elsevier Interactive Patient Education ©2016 [employer]

## 2015-08-21 LAB — URINE CULTURE

## 2015-08-22 ENCOUNTER — Telehealth (HOSPITAL_BASED_OUTPATIENT_CLINIC_OR_DEPARTMENT_OTHER): Payer: Self-pay | Admitting: Emergency Medicine

## 2015-08-22 NOTE — Telephone Encounter (Signed)
Post ED Visit - Positive Culture Follow-up  Culture report reviewed by antimicrobial stewardship pharmacist:  []  Enzo BiNathan Batchelder, Pharm.D. []  Celedonio MiyamotoJeremy Frens, Pharm.D., BCPS []  Garvin FilaMike Maccia, Pharm.D. []  Georgina PillionElizabeth Martin, Pharm.D., BCPS []  MayhillMinh Pham, [address] BoulevardPharm[address]D., BCPS, AAHIVP []  Estella HuskMichelle Turner, Pharm.D., BCPS, AAHIVP []  Cassie Stewart, Pharm.D. [x]  Sherle Poeob Vincent, [address]Pharm[address]  Positive urine culture Proteus Treated with cephalexin, organism sensitive to the same and no further patient follow-up is required at this time.  Berle MullMiller, Tyee Vandevoorde 08/22/2015, 11:11 AM

## 2015-09-01 ENCOUNTER — Emergency Department (INDEPENDENT_AMBULATORY_CARE_PROVIDER_SITE_OTHER)
Admission: EM | Admit: 2015-09-01 | Discharge: 2015-09-01 | Disposition: A | Discharge status: Home / Self Care | Payer: Medicare Other | Source: Home / Self Care | Attending: Emergency Medicine | Admitting: Emergency Medicine

## 2015-09-01 ENCOUNTER — Encounter (HOSPITAL_COMMUNITY): Payer: Self-pay | Admitting: Emergency Medicine

## 2015-09-01 DIAGNOSIS — S46012A Strain of muscle(s) and tendon(s) of the rotator cuff of left shoulder, initial encounter: Secondary | ICD-10-CM | POA: Diagnosis not present

## 2015-09-01 DIAGNOSIS — S161XXA Strain of muscle, fascia and tendon at neck level, initial encounter: Secondary | ICD-10-CM | POA: Diagnosis not present

## 2015-09-01 MED ORDER — DICLOFENAC SODIUM 1 % TD GEL: 2.0000 g | Freq: Four times a day (QID) | TRANSDERMAL | Status: AC

## 2015-09-01 NOTE — Discharge Instructions (Signed)
Your pain is coming from a tight muscle in your neck. I'm also worried that your rotator cuff is involved. Use voltaren gel 4 times a day. Apply heat 4 times a day. If this is not improving in 1 week, please follow-up with Dr. Ophelia CharterYates. He is an orthopedic specialist.  You can try calling [phone].  This is the physician referral line for Medicare. You can also call our office and asked to speak to [facility], to see if she can provide assistance.

## 2015-09-01 NOTE — ED Provider Notes (Signed)
CSN: [id number]     Arrival date & time 09/01/15  1311 History   First MD Initiated Contact with Patient 09/01/15 1431     Chief Complaint  Patient presents with  . Otalgia  . Torticollis   (Consider location/radiation/quality/duration/timing/severity/associated sxs/prior Treatment) HPI  He is a 81 year old man here for evaluation of left-sided neck pain. He is accompanied by his son who assists in history. He states he has pain along the left side of his neck and into his left shoulder. He denies any injury or trauma. He denies any radiating pain. No numbness, tingling, weakness in his left arm or hand. His son states he has complained of neck pain for approximately 2 weeks. His son changed his pillow a few days ago and states his dad reported improvement over the last few days. Patient states he tried heat without improvement. He has not tried any medications.  Past Medical History  Diagnosis Date  . Hypertension   . Glaucoma   . Emphysema lung (HCC)   . COPD (chronic obstructive pulmonary disease) (HCC)   . CVA (cerebral infarction)   . BPH (benign prostatic hyperplasia)    History reviewed. No pertinent past surgical history. History reviewed. No pertinent family history. Social History  Substance Use Topics  . Smoking status: Former Smoker -- 0.50 packs/day for 20 years    Types: Cigarettes    Quit date: 04/26/1995  . Smokeless tobacco: Never Used  . Alcohol Use: No    Review of Systems As in history of present illness Allergies  Review of patient's allergies indicates no known allergies.  Home Medications   Prior to Admission medications   Medication Sig Start Date End Date Taking? Authorizing Provider  amLODipine (NORVASC) 10 MG tablet Take 10 mg by mouth daily.    Historical Provider, MD  aspirin EC 81 MG tablet Take 81 mg by mouth daily.    Historical Provider, MD  brimonidine (ALPHAGAN) 0.2 % ophthalmic solution Place 1 drop into the right eye 3 (three) times  daily.     Historical Provider, MD  brinzolamide (AZOPT) 1 % ophthalmic suspension Place 1 drop into both eyes 3 (three) times daily.     Historical Provider, MD  diclofenac sodium (VOLTAREN) 1 % GEL Apply 2 g topically 4 (four) times daily. 09/01/15   Charm RingsErin J Danicia Terhaar, MD  ipratropium-albuterol (DUONEB) 0.5-2.5 (3) MG/3ML SOLN Take 3 mLs by nebulization 4 (four) times daily. 05/28/15   Coralyn HellingVineet Sood, MD  predniSONE (DELTASONE) 10 MG tablet Take 10 mg by mouth daily with breakfast.    Historical Provider, MD  rosuvastatin (CRESTOR) 10 MG tablet Take 10 mg by mouth daily.    Historical Provider, MD  tamsulosin (FLOMAX) 0.4 MG CAPS capsule Take 1 capsule (0.4 mg total) by mouth daily. 05/12/15   Christina P Rama, MD  travoprost, benzalkonium, (TRAVATAN) 0.004 % ophthalmic solution Place 1 drop into both eyes at bedtime.     Historical Provider, MD   Meds Ordered and Administered this Visit  Medications - No data to display  BP 135/81 mmHg  Pulse 84  Temp(Src) 97.5 F (36.4 C) (Oral)  Resp 28  SpO2 98% No data found.   Physical Exam  Constitutional: He is oriented to person, place, and time. He appears well-developed. No distress.  Elderly thin man.  Neck:  No spinous process tenderness. He has symmetric but limited range of motion.  He has tightness of the left trapezius muscle. This is mildly tender to palpation. He  has limited range of motion of his left shoulder, particularly in abduction. It is unclear if this is new or at his baseline.  Cardiovascular: Normal rate.   Pulmonary/Chest:  He is slightly tachypneic, but this appears to be his baseline.  Neurological: He is alert and oriented to person, place, and time.    ED Course  Procedures (including critical care time)  Labs Review Labs Reviewed - No data to display  Imaging Review No results found.    MDM   1. Strain of neck muscle, initial encounter   2. Rotator cuff (capsule) sprain and strain, left, initial encounter     Recommended continued use of the new pillow. We'll treat with voltaren gel and heat. If symptoms not improving over the next week, follow-up with orthopedics.    Charm Rings, MD 09/01/15 [phone]

## 2015-09-01 NOTE — ED Notes (Signed)
[facility] a [facility] left [facility] and neck pain that has been ongoing for 2 weeks.

## 2015-09-25 ENCOUNTER — Emergency Department (HOSPITAL_COMMUNITY)
Admission: EM | Admit: 2015-09-25 | Discharge: 2015-09-25 | Disposition: A | Discharge status: Home / Self Care | Payer: Medicare Other | Attending: Emergency Medicine | Admitting: Emergency Medicine

## 2015-09-25 ENCOUNTER — Encounter (HOSPITAL_COMMUNITY): Payer: Self-pay | Admitting: Emergency Medicine

## 2015-09-25 DIAGNOSIS — N400 Enlarged prostate without lower urinary tract symptoms: Secondary | ICD-10-CM | POA: Insufficient documentation

## 2015-09-25 DIAGNOSIS — Z79899 Other long term (current) drug therapy: Secondary | ICD-10-CM | POA: Insufficient documentation

## 2015-09-25 DIAGNOSIS — T83091A Other mechanical complication of indwelling urethral catheter, initial encounter: Secondary | ICD-10-CM

## 2015-09-25 DIAGNOSIS — Y658 Other specified misadventures during surgical and medical care: Secondary | ICD-10-CM | POA: Insufficient documentation

## 2015-09-25 DIAGNOSIS — Z791 Long term (current) use of non-steroidal anti-inflammatories (NSAID): Secondary | ICD-10-CM | POA: Diagnosis not present

## 2015-09-25 DIAGNOSIS — J439 Emphysema, unspecified: Secondary | ICD-10-CM | POA: Insufficient documentation

## 2015-09-25 DIAGNOSIS — I10 Essential (primary) hypertension: Secondary | ICD-10-CM | POA: Insufficient documentation

## 2015-09-25 DIAGNOSIS — T83098A Other mechanical complication of other indwelling urethral catheter, initial encounter: Secondary | ICD-10-CM | POA: Insufficient documentation

## 2015-09-25 DIAGNOSIS — R339 Retention of urine, unspecified: Secondary | ICD-10-CM | POA: Diagnosis present

## 2015-09-25 DIAGNOSIS — Z7952 Long term (current) use of systemic steroids: Secondary | ICD-10-CM | POA: Diagnosis not present

## 2015-09-25 DIAGNOSIS — H409 Unspecified glaucoma: Secondary | ICD-10-CM | POA: Diagnosis not present

## 2015-09-25 DIAGNOSIS — Z7982 Long term (current) use of aspirin: Secondary | ICD-10-CM | POA: Diagnosis not present

## 2015-09-25 DIAGNOSIS — Z87891 Personal history of nicotine dependence: Secondary | ICD-10-CM | POA: Diagnosis not present

## 2015-09-25 NOTE — ED Notes (Signed)
Pt states he has a foley catheter in place and it is not draining  Pt states he is very uncomfortable  Pt states he had urine coming out around the catheter earlier tonight

## 2015-09-25 NOTE — ED Provider Notes (Signed)
CSN: [id number]     Arrival date & time 09/25/15  0118 History  By signing my name below, I, Phillis HaggisGabriella Gaje, attest that this documentation has been prepared under the direction and in the presence of Paula LibraJohn Malon Siddall, MD. Electronically Signed: Phillis HaggisGabriella Gaje, ED Scribe. 09/25/2015. 3:37 AM.  Chief Complaint  Patient presents with  . Urinary Retention   The history is provided by the patient. No language interpreter was used.   HPI Comments: Jesus Carey is a 81 y.o. male who presents to the Emergency Department complaining of urinary retention onset several hours ago. Per family, pt has had a Foley catheter in place for 3 months due to prostate disease. There was moderate to severe associated lower abdominal pain and bladder distention. The catheter was placed in triage resulting in relief of the patient's discomfort and the Foley is now draining urine freely.   Past Medical History  Diagnosis Date  . Hypertension   . Glaucoma   . Emphysema lung (HCC)   . COPD (chronic obstructive pulmonary disease) (HCC)   . CVA (cerebral infarction)   . BPH (benign prostatic hyperplasia)    History reviewed. No pertinent past surgical history. History reviewed. No pertinent family history. Social History  Substance Use Topics  . Smoking status: Former Smoker -- 0.50 packs/day for 20 years    Types: Cigarettes    Quit date: 04/26/1995  . Smokeless tobacco: Never Used  . Alcohol Use: No    Review of Systems 10 Systems reviewed and all are negative for acute change except as noted in the HPI. Allergies  Review of patient's allergies indicates no known allergies.  Home Medications   Prior to Admission medications   Medication Sig Start Date End Date Taking? Authorizing Provider  amLODipine (NORVASC) 10 MG tablet Take 10 mg by mouth daily.   Yes Historical Provider, MD  aspirin EC 81 MG tablet Take 81 mg by mouth daily.   Yes Historical Provider, MD  brimonidine (ALPHAGAN) 0.2 % ophthalmic  solution Place 1 drop into the right eye 3 (three) times daily.    Yes Historical Provider, MD  brinzolamide (AZOPT) 1 % ophthalmic suspension Place 1 drop into both eyes 3 (three) times daily.    Yes Historical Provider, MD  diclofenac sodium (VOLTAREN) 1 % GEL Apply 2 g topically 4 (four) times daily. 09/01/15  Yes Charm RingsErin J Honig, MD  ipratropium-albuterol (DUONEB) 0.5-2.5 (3) MG/3ML SOLN Take 3 mLs by nebulization 4 (four) times daily. 05/28/15  Yes Coralyn HellingVineet Sood, MD  predniSONE (DELTASONE) 10 MG tablet Take 10 mg by mouth daily with breakfast.   Yes Historical Provider, MD  rosuvastatin (CRESTOR) 10 MG tablet Take 10 mg by mouth daily.   Yes Historical Provider, MD  tamsulosin (FLOMAX) 0.4 MG CAPS capsule Take 1 capsule (0.4 mg total) by mouth daily. 05/12/15  Yes Christina P Rama, MD  travoprost, benzalkonium, (TRAVATAN) 0.004 % ophthalmic solution Place 1 drop into both eyes at bedtime.    Yes Historical Provider, MD   BP 163/112 mmHg  Pulse 94  Temp(Src) 97.4 F (36.3 C) (Oral)  Resp 26  SpO2 93%   Physical Exam General: Well-developed, cachectic male in no acute distress; appearance consistent with age of record HENT: normocephalic; atraumatic Eyes: left pupil round and sluggish; right pupil irregular; arcus senilis bilaterally; extraocular muscles grossly intact Neck: supple Heart: regular rate and rhythm Lungs: clear to auscultation bilaterally Abdomen: soft; nondistended; mild suprapubic tenderness; bowel sounds present GU: foley catheter in place  draining blood-tinged urine; non tender right inguinal hernia Extremities: arthritic changes; chronic-appearing flexion deformity at the right fifth finger distal PIP joint Neurologic: Awake, alert; noted to move all extremities; no facial droop Skin: Warm and dry Psychiatric: Flat affect  ED Course  Procedures (including critical care time)   MDM   Final diagnoses:  Obstructed Foley catheter, initial encounter [facility])   I  personally performed the services described in this documentation, which was scribed in my presence. The recorded information has been reviewed and is accurate.    Paula Libra, MD 09/25/15 [phone]

## 2015-09-25 NOTE — ED Notes (Signed)
D/c instructions reviewed w/ pt and family - pt and family deny any further questions or concerns at present.\ 

## 2015-10-14 ENCOUNTER — Encounter (HOSPITAL_COMMUNITY): Payer: Self-pay | Admitting: *Deleted

## 2015-10-14 ENCOUNTER — Emergency Department (HOSPITAL_COMMUNITY)
Admission: EM | Admit: 2015-10-14 | Discharge: 2015-10-14 | Disposition: A | Discharge status: Home / Self Care | Payer: Medicare Other | Attending: Emergency Medicine | Admitting: Emergency Medicine

## 2015-10-14 DIAGNOSIS — Z79899 Other long term (current) drug therapy: Secondary | ICD-10-CM | POA: Insufficient documentation

## 2015-10-14 DIAGNOSIS — Z7952 Long term (current) use of systemic steroids: Secondary | ICD-10-CM | POA: Insufficient documentation

## 2015-10-14 DIAGNOSIS — H409 Unspecified glaucoma: Secondary | ICD-10-CM | POA: Diagnosis not present

## 2015-10-14 DIAGNOSIS — Z8673 Personal history of transient ischemic attack (TIA), and cerebral infarction without residual deficits: Secondary | ICD-10-CM | POA: Diagnosis not present

## 2015-10-14 DIAGNOSIS — I10 Essential (primary) hypertension: Secondary | ICD-10-CM | POA: Insufficient documentation

## 2015-10-14 DIAGNOSIS — R34 Anuria and oliguria: Secondary | ICD-10-CM | POA: Diagnosis not present

## 2015-10-14 DIAGNOSIS — Z7982 Long term (current) use of aspirin: Secondary | ICD-10-CM | POA: Insufficient documentation

## 2015-10-14 DIAGNOSIS — R339 Retention of urine, unspecified: Secondary | ICD-10-CM | POA: Insufficient documentation

## 2015-10-14 DIAGNOSIS — J449 Chronic obstructive pulmonary disease, unspecified: Secondary | ICD-10-CM | POA: Insufficient documentation

## 2015-10-14 DIAGNOSIS — Z87891 Personal history of nicotine dependence: Secondary | ICD-10-CM | POA: Diagnosis not present

## 2015-10-14 DIAGNOSIS — N400 Enlarged prostate without lower urinary tract symptoms: Secondary | ICD-10-CM | POA: Insufficient documentation

## 2015-10-14 DIAGNOSIS — R1030 Lower abdominal pain, unspecified: Secondary | ICD-10-CM | POA: Insufficient documentation

## 2015-10-14 LAB — I-STAT CHEM 8, ED
BUN: 12 mg/dL (ref 6–20)
CHLORIDE: 102 mmol/L (ref 101–111)
CREATININE: 0.9 mg/dL (ref 0.61–1.24)
Calcium, Ion: 1.11 mmol/L — ABNORMAL LOW (ref 1.13–1.30)
Glucose, Bld: 101 mg/dL — ABNORMAL HIGH (ref 65–99)
HCT: 47 % (ref 39.0–52.0)
Hemoglobin: 16 g/dL (ref 13.0–17.0)
POTASSIUM: 4.2 mmol/L (ref 3.5–5.1)
SODIUM: 142 mmol/L (ref 135–145)
TCO2: 31 mmol/L (ref 0–100)

## 2015-10-14 MED ORDER — CEPHALEXIN 500 MG PO CAPS: 500.0000 mg | ORAL_CAPSULE | Freq: Two times a day (BID) | ORAL | Status: DC

## 2015-10-14 NOTE — ED Notes (Signed)
Pt has an indwelling Foley catheter x last 3 months.  Pt's son at bedside sts he refused urinary surgery because of pt's age.  Pt is here because his catheter is clogged up.

## 2015-10-14 NOTE — ED Provider Notes (Signed)
CSN: [id number]     Arrival date & time 10/14/15  0524 History   First MD Initiated Contact with Patient 10/14/15 [phone]     Chief Complaint  Patient presents with  . Urinary Retention    HPI  Sinda DuWilliam S Mccarrick is a 82 y.o. male with a PMH of HTN, COPD, CVA, BPH with indwelling foley who presents to the ED with urinary retention. His son is present at bedside, and states his foley has not been draining as well, which started this morning. He also reports the patient has been complaining of lower abdominal pain, which he attributes to his bladder "being full." He denies exacerbating or alleviating factors. He denies fever, chills, nausea, vomiting, diarrhea, constipation.   Past Medical History  Diagnosis Date  . Hypertension   . Glaucoma   . Emphysema lung (HCC)   . COPD (chronic obstructive pulmonary disease) (HCC)   . CVA (cerebral infarction)   . BPH (benign prostatic hyperplasia)    History reviewed. No pertinent past surgical history. No family history on file. Social History  Substance Use Topics  . Smoking status: Former Smoker -- 0.50 packs/day for 20 years    Types: Cigarettes    Quit date: 04/26/1995  . Smokeless tobacco: Never Used  . Alcohol Use: No      Review of Systems  Constitutional: Negative for fever and chills.  Gastrointestinal: Positive for abdominal pain. Negative for nausea, vomiting, diarrhea and constipation.  Genitourinary: Positive for decreased urine volume.  All other systems reviewed and are negative.     Allergies  Review of patient's allergies indicates no known allergies.  Home Medications   Prior to Admission medications   Medication Sig Start Date End Date Taking? Authorizing Provider  amLODipine (NORVASC) 10 MG tablet Take 10 mg by mouth daily.   Yes Historical Provider, MD  aspirin EC 81 MG tablet Take 81 mg by mouth daily.   Yes Historical Provider, MD  brimonidine (ALPHAGAN) 0.2 % ophthalmic solution Place 1 drop into the right eye  3 (three) times daily.    Yes Historical Provider, MD  brinzolamide (AZOPT) 1 % ophthalmic suspension Place 1 drop into both eyes 3 (three) times daily.    Yes Historical Provider, MD  ipratropium-albuterol (DUONEB) 0.5-2.5 (3) MG/3ML SOLN Take 3 mLs by nebulization 4 (four) times daily. 05/28/15  Yes Coralyn HellingVineet Sood, MD  predniSONE (DELTASONE) 10 MG tablet Take 10 mg by mouth daily with breakfast.   Yes Historical Provider, MD  rosuvastatin (CRESTOR) 10 MG tablet Take 10 mg by mouth daily.   Yes Historical Provider, MD  tamsulosin (FLOMAX) 0.4 MG CAPS capsule Take 1 capsule (0.4 mg total) by mouth daily. 05/12/15  Yes Christina P Rama, MD  travoprost, benzalkonium, (TRAVATAN) 0.004 % ophthalmic solution Place 1 drop into both eyes at bedtime.    Yes Historical Provider, MD  cephALEXin (KEFLEX) 500 MG capsule Take 1 capsule (500 mg total) by mouth 2 (two) times daily. 10/14/15   Mady GemmaElizabeth C Westfall, PA-C  diclofenac sodium (VOLTAREN) 1 % GEL Apply 2 g topically 4 (four) times daily. Patient not taking: Reported on 10/14/2015 09/01/15   Charm RingsErin J Honig, MD    BP 185/103 mmHg  Pulse 80  Temp(Src) 97.6 F (36.4 C) (Oral)  Resp 14  SpO2 97% Physical Exam  Constitutional: He is oriented to person, place, and time. He appears well-developed and well-nourished. No distress.  HENT:  Head: Normocephalic and atraumatic.  Right Ear: External ear normal.  Left  Ear: External ear normal.  Nose: Nose normal.  Mouth/Throat: Uvula is midline, oropharynx is clear and moist and mucous membranes are normal.  Eyes: Conjunctivae, EOM and lids are normal. Pupils are equal, round, and reactive to light. Right eye exhibits no discharge. Left eye exhibits no discharge. No scleral icterus.  Neck: Normal range of motion. Neck supple.  Cardiovascular: Normal rate, regular rhythm, normal heart sounds, intact distal pulses and normal pulses.   Pulmonary/Chest: Effort normal and breath sounds normal. No respiratory distress. He  has no wheezes. He has no rales.  Abdominal: Soft. Normal appearance and bowel sounds are normal. He exhibits no distension and no mass. There is tenderness. There is no rigidity, no rebound and no guarding.  Mild TTP in suprapubic region.  Musculoskeletal: Normal range of motion. He exhibits no edema or tenderness.  Neurological: He is alert and oriented to person, place, and time. He has normal strength. No cranial nerve deficit or sensory deficit.  Skin: Skin is warm, dry and intact. No rash noted. He is not diaphoretic. No erythema. No pallor.  Psychiatric: He has a normal mood and affect. His speech is normal and behavior is normal.  Nursing note and vitals reviewed.   ED Course  Procedures (including critical care time)  Labs Review Labs Reviewed  I-STAT CHEM 8, ED - Abnormal; Notable for the following:    Glucose, Bld 101 (*)    Calcium, Ion 1.11 (*)    All other components within normal limits    Imaging Review No results found.   I have personally reviewed and evaluated these lab results as part of my medical decision-making.   EKG Interpretation None      MDM   Final diagnoses:  Urinary retention    82 year old male presents with urinary retention, which started today prior to arrival. Reports associated lower abdominal pain. Denies fever, chills, N/V/D/C. Patient is afebrile. Vital signs stable. Mild tenderness to palpation suprapubic region. No rebound, guarding, or masses. Bladder scan 271. Of note, patient was evaluated for the same symptoms 12/13, had his foley replaced, and was subsequently discharged in stable condition.  Chem 8 unremarkable, creatinine within normal limits.  Foley replaced in the ED. Patient discussed with and seen by Dr. Nicanor Alcon. Will discharge with keflex and have patient follow-up with urology for further evaluation and management. Discussed plan with patient and his son, who are in agreement. Patient is non-toxic and well-appearing, feel  he is stable for discharge at this time. Return precautions discussed.   BP 185/103 mmHg  Pulse 80  Temp(Src) 97.6 F (36.4 C) (Oral)  Resp 14  SpO2 97%     Mady Gemma, PA-C 10/14/15 1133  April Palumbo, MD 10/15/15 [phone]

## 2015-10-14 NOTE — ED Notes (Signed)
Pt requested leg bag for home

## 2015-10-14 NOTE — Discharge Instructions (Signed)
1. Medications: keflex, usual home medications 2. Treatment: rest, drink plenty of fluids 3. Follow Up: please followup with your urologist this week for discussion of your diagnoses and further evaluation after today's visit; please return to the ER for high fever, severe abdominal pain, vomiting, new or worsening symptoms   Acute Urinary Retention, Male Acute urinary retention is the temporary inability to urinate. This is a common problem in older men. As men age their prostates become larger and block the flow of urine from the bladder. This is usually a problem that has come on gradually.  HOME CARE INSTRUCTIONS If you are sent home with a Foley catheter and a drainage system, you will need to discuss the best course of action with your health care provider. While the catheter is in, maintain a good intake of fluids. Keep the drainage bag emptied and lower than your catheter. This is so that contaminated urine will not flow back into your bladder, which could lead to a urinary tract infection. There are two main types of drainage bags. One is a large bag that usually is used at night. It has a good capacity that will allow you to sleep through the night without having to empty it. The second type is called a leg bag. It has a smaller capacity, so it needs to be emptied more frequently. However, the main advantage is that it can be attached by a leg strap and can go underneath your clothing, allowing you the freedom to move about or leave your home. Only take over-the-counter or prescription medicines for pain, discomfort, or fever as directed by your health care provider.  SEEK MEDICAL CARE IF:  You develop a low-grade fever.  You experience spasms or leakage of urine with the spasms. SEEK IMMEDIATE MEDICAL CARE IF:   You develop chills or fever.  Your catheter stops draining urine.  Your catheter falls out.  You start to develop increased bleeding that does not respond to rest and  increased fluid intake. MAKE SURE YOU:  Understand these instructions.  Will watch your condition.  Will get help right away if you are not doing well or get worse.   This information is not intended to replace advice given to you by your health care provider. Make sure you discuss any questions you have with your health care provider.   Document Released: 01/05/2001 Document Revised: 02/13/2015 Document Reviewed: 03/10/2013 Elsevier Interactive Patient Education [employer] Inc[employer]

## 2015-10-22 ENCOUNTER — Encounter (HOSPITAL_COMMUNITY): Payer: Self-pay

## 2015-10-22 ENCOUNTER — Emergency Department (HOSPITAL_COMMUNITY)
Admission: EM | Admit: 2015-10-22 | Discharge: 2015-10-22 | Disposition: A | Discharge status: Home / Self Care | Payer: Medicare Other | Attending: Emergency Medicine | Admitting: Emergency Medicine

## 2015-10-22 DIAGNOSIS — Z8673 Personal history of transient ischemic attack (TIA), and cerebral infarction without residual deficits: Secondary | ICD-10-CM | POA: Insufficient documentation

## 2015-10-22 DIAGNOSIS — Z7952 Long term (current) use of systemic steroids: Secondary | ICD-10-CM | POA: Diagnosis not present

## 2015-10-22 DIAGNOSIS — Z79899 Other long term (current) drug therapy: Secondary | ICD-10-CM | POA: Diagnosis not present

## 2015-10-22 DIAGNOSIS — N400 Enlarged prostate without lower urinary tract symptoms: Secondary | ICD-10-CM | POA: Insufficient documentation

## 2015-10-22 DIAGNOSIS — J439 Emphysema, unspecified: Secondary | ICD-10-CM | POA: Diagnosis not present

## 2015-10-22 DIAGNOSIS — Z7982 Long term (current) use of aspirin: Secondary | ICD-10-CM | POA: Diagnosis not present

## 2015-10-22 DIAGNOSIS — I10 Essential (primary) hypertension: Secondary | ICD-10-CM | POA: Insufficient documentation

## 2015-10-22 DIAGNOSIS — Z760 Encounter for issue of repeat prescription: Secondary | ICD-10-CM | POA: Insufficient documentation

## 2015-10-22 DIAGNOSIS — Z87891 Personal history of nicotine dependence: Secondary | ICD-10-CM | POA: Diagnosis not present

## 2015-10-22 DIAGNOSIS — H409 Unspecified glaucoma: Secondary | ICD-10-CM | POA: Diagnosis not present

## 2015-10-22 DIAGNOSIS — Z792 Long term (current) use of antibiotics: Secondary | ICD-10-CM | POA: Diagnosis not present

## 2015-10-22 MED ORDER — AMLODIPINE BESYLATE 10 MG PO TABS: 10.0000 mg | ORAL_TABLET | Freq: Every day | ORAL | Status: AC

## 2015-10-22 MED ORDER — ROSUVASTATIN CALCIUM 10 MG PO TABS: 10.0000 mg | ORAL_TABLET | Freq: Every day | ORAL | Status: AC

## 2015-10-22 NOTE — ED Notes (Signed)
Pt presents needing medication refill.  Pt's family reports "his PCP died the day before yesterday and he is completely out of medication.  His new doctor will not refill the medications w/o seeing him."  Pt needs Amlodipine Besylate 10mg  q day and Rosuvastatin Calcium 10mg  q day refilled.  Pt has an appointment w/ new PCP on 1/24.

## 2015-10-22 NOTE — Discharge Instructions (Signed)
Medicine Refill at the Emergency Department  We have refilled your medicine today, but it is best for you to get refills through your primary health care provider's office. In the future, please plan ahead so you do not need to get refills from the emergency department.  If the medicine we refilled was a maintenance medicine, you may have received only enough to get you by until you are able to see your regular health care provider.     This information is not intended to replace advice given to you by your health care provider. Make sure you discuss any questions you have with your health care provider.     Document Released: 01/16/2004 Document Revised: 10/20/2014 Document Reviewed: 01/06/2014  Elsevier Interactive Patient Education 2016 Elsevier Inc.

## 2015-10-22 NOTE — ED Provider Notes (Signed)
CSN: [id number]     Arrival date & time 10/22/15  1723 History  By signing my name below, I, Jesus Carey, attest that this documentation has been prepared under the direction and in the presence of non-physician practitioner, Roxy Horseman, PA-C. Electronically Signed: Freida Carey, Scribe. 10/22/2015. 6:13 PM.    Chief Complaint  Patient presents with  . Medication Refill    The history is provided by the patient. No language interpreter was used.     HPI Comments:  Jesus Carey is a 82 y.o. male who presents to the Emergency Department for a medication refill. He is in need of his  rosuvastatin and amlodipine; 10 mg each. Pt has no other complaints or symptoms at this time. He has an appointment with PCP, Dr. Parke Simmers, on 11/06/15.   Past Medical History  Diagnosis Date  . Hypertension   . Glaucoma   . Emphysema lung (HCC)   . COPD (chronic obstructive pulmonary disease) (HCC)   . CVA (cerebral infarction)   . BPH (benign prostatic hyperplasia)    History reviewed. No pertinent past surgical history. History reviewed. No pertinent family history. Social History  Substance Use Topics  . Smoking status: Former Smoker -- 0.50 packs/day for 20 years    Types: Cigarettes    Quit date: 04/26/1995  . Smokeless tobacco: Never Used  . Alcohol Use: No    Review of Systems  Constitutional: Negative for fever and chills.       + Medication Refill  Respiratory: Negative for shortness of breath.   Cardiovascular: Negative for chest pain.    Allergies  Review of patient's allergies indicates no known allergies.  Home Medications   Prior to Admission medications   Medication Sig Start Date End Date Taking? Authorizing Provider  amLODipine (NORVASC) 10 MG tablet Take 1 tablet (10 mg total) by mouth daily. 10/22/15   Roxy Horseman, PA-C  aspirin EC 81 MG tablet Take 81 mg by mouth daily.    Historical Provider, MD  brimonidine (ALPHAGAN) 0.2 % ophthalmic solution Place 1 drop  into the right eye 3 (three) times daily.     Historical Provider, MD  brinzolamide (AZOPT) 1 % ophthalmic suspension Place 1 drop into both eyes 3 (three) times daily.     Historical Provider, MD  cephALEXin (KEFLEX) 500 MG capsule Take 1 capsule (500 mg total) by mouth 2 (two) times daily. 10/14/15   Mady Gemma, PA-C  diclofenac sodium (VOLTAREN) 1 % GEL Apply 2 g topically 4 (four) times daily. Patient not taking: Reported on 10/14/2015 09/01/15   Charm Rings, MD  ipratropium-albuterol (DUONEB) 0.5-2.5 (3) MG/3ML SOLN Take 3 mLs by nebulization 4 (four) times daily. 05/28/15   Coralyn Helling, MD  predniSONE (DELTASONE) 10 MG tablet Take 10 mg by mouth daily with breakfast.    Historical Provider, MD  rosuvastatin (CRESTOR) 10 MG tablet Take 1 tablet (10 mg total) by mouth daily. 10/22/15   Roxy Horseman, PA-C  tamsulosin (FLOMAX) 0.4 MG CAPS capsule Take 1 capsule (0.4 mg total) by mouth daily. 05/12/15   Christina P Rama, MD  travoprost, benzalkonium, (TRAVATAN) 0.004 % ophthalmic solution Place 1 drop into both eyes at bedtime.     Historical Provider, MD   BP 120/75 mmHg  Pulse 92  Temp(Src) 97.5 F (36.4 C) (Oral)  Resp 14  SpO2 94% Physical Exam  Constitutional: He is oriented to person, place, and time. He appears well-developed and well-nourished. No distress.  HENT:  Head:  Normocephalic and atraumatic.  Eyes: Conjunctivae and EOM are normal. Pupils are equal, round, and reactive to light. Right eye exhibits no discharge. Left eye exhibits no discharge. No scleral icterus.  Neck: Normal range of motion. Neck supple. No JVD present.  Cardiovascular: Normal rate, regular rhythm and normal heart sounds.  Exam reveals no gallop and no friction rub.   No murmur heard. Pulmonary/Chest: Effort normal and breath sounds normal. No respiratory distress. He has no wheezes. He has no rales. He exhibits no tenderness.  Abdominal: Soft. He exhibits no distension and no mass. There is no  tenderness. There is no rebound and no guarding.  Musculoskeletal: Normal range of motion. He exhibits no edema or tenderness.  Neurological: He is alert and oriented to person, place, and time.  Skin: Skin is warm and dry.  Psychiatric: He has a normal mood and affect. His behavior is normal. Judgment and thought content normal.  Nursing note and vitals reviewed.   ED Course  Procedures   DIAGNOSTIC STUDIES:  Oxygen Saturation is 94% on RA, adequate by my interpretation.    COORDINATION OF CARE:  6:11 PM Will discharge with Rx for Amlodipine and Rosuvastatin. Discussed plan with pt at bedside and pt agreed to plan.   MDM   Final diagnoses:  Medication refill    Patient here for medication refill.  No complaints.  PCP just died, has nobody to refill his medications.  I personally performed the services described in this documentation, which was scribed in my presence. The recorded information has been reviewed and is accurate.     I personally performed the services described in this documentation, which was scribed in my presence. The recorded information has been reviewed and is accurate.      Roxy Horsemanobert Tabrina Esty, PA-C 10/22/15 1824  Loren Raceravid Yelverton, MD 10/23/15 [phone]

## 2015-10-30 ENCOUNTER — Other Ambulatory Visit: Payer: Self-pay | Admitting: Pulmonary Disease

## 2015-10-30 IMAGING — CR DG CHEST 1V PORT
2 series · 2 of 2 positions shown · non-contrast
Comparison: 03/15/2015

CLINICAL DATA: Increasing shortness of breath, respiratory distress

EXAM:
PORTABLE CHEST - 1 VIEW

[AP (1 of 2)]
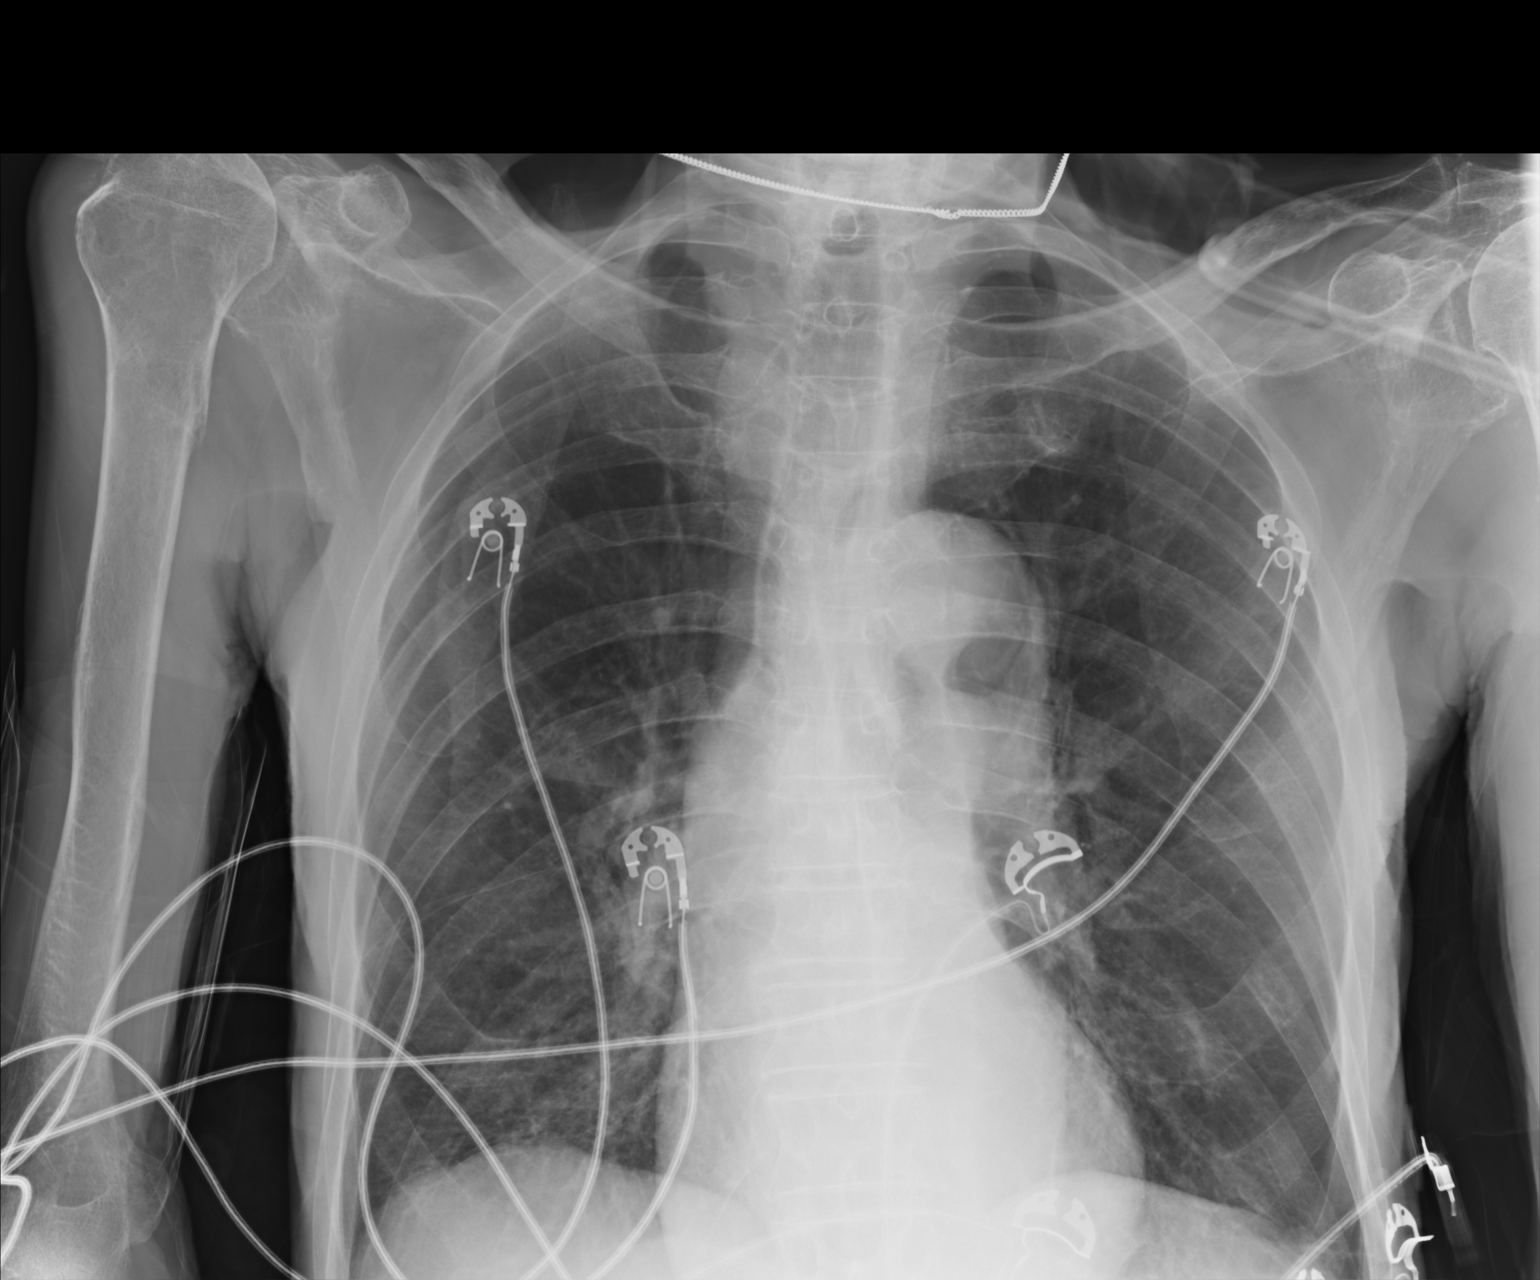

[AP (2 of 2)]
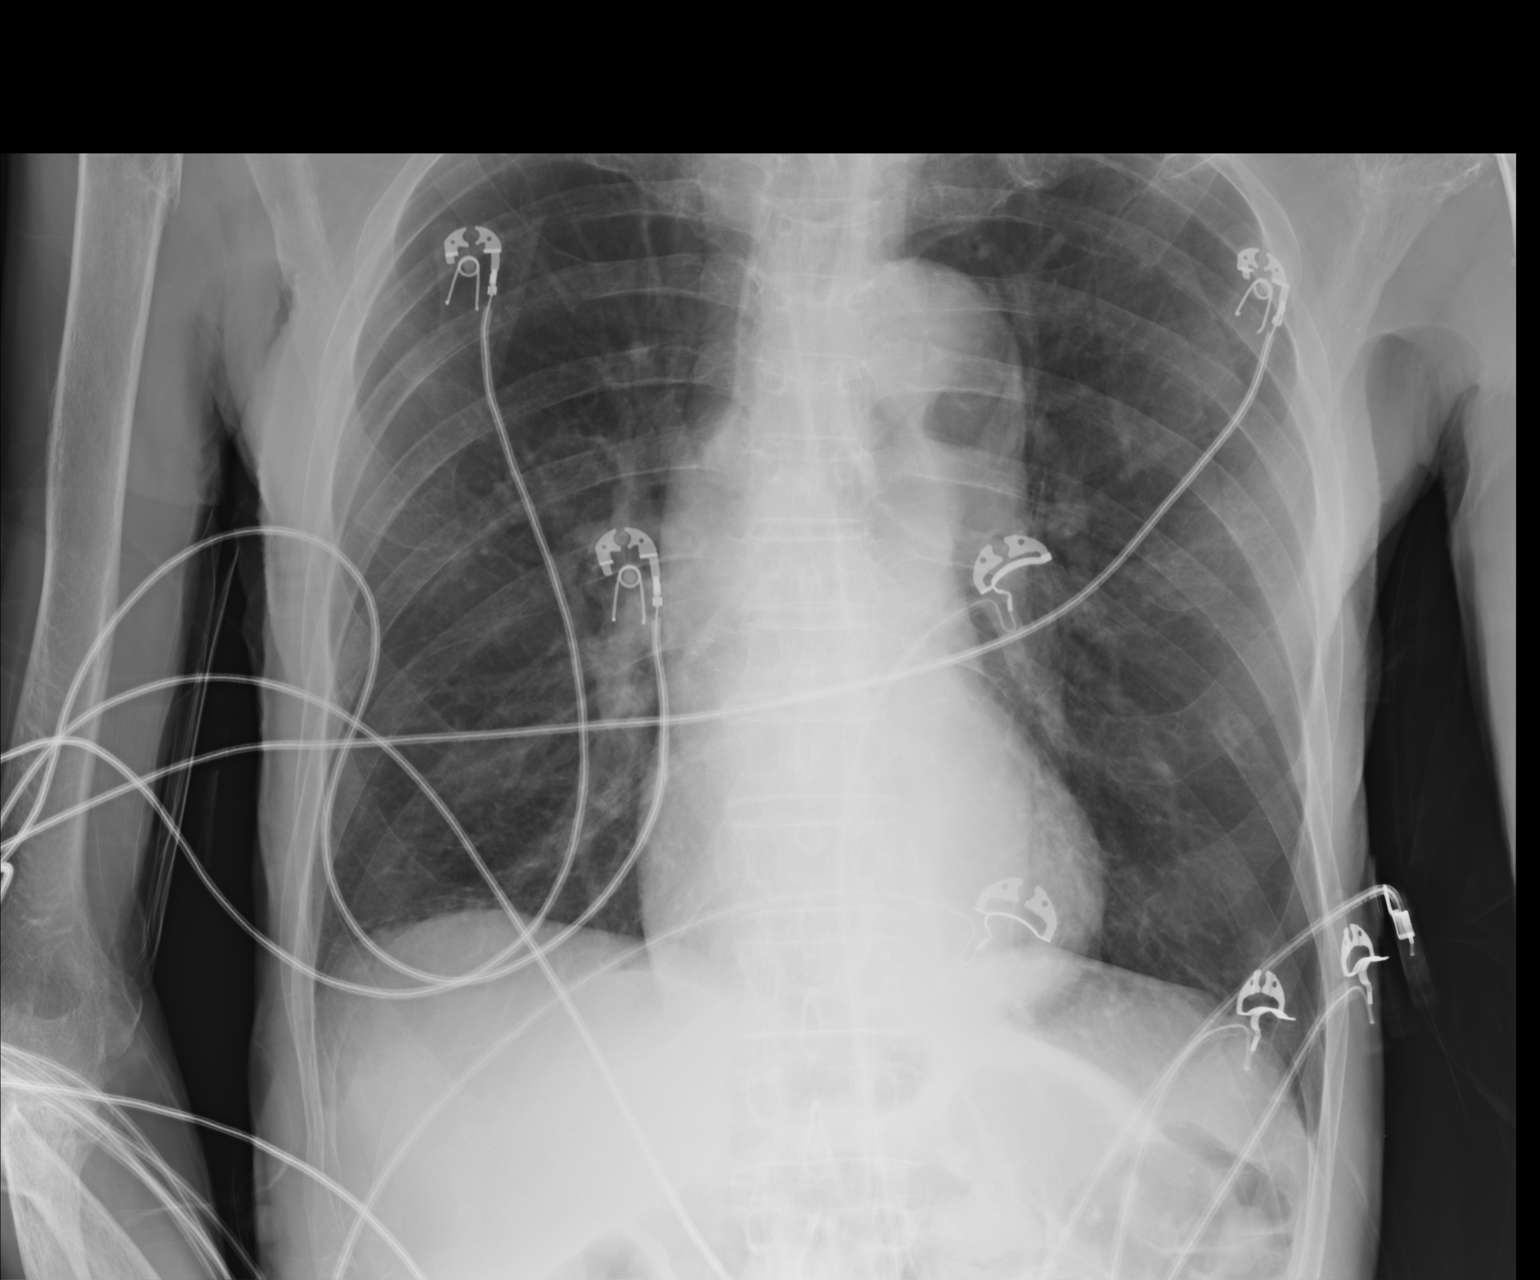

[2 of 2 positions shown; findings below may reference images not displayed]

FINDINGS: Chronic interstitial markings/ emphysematous changes. No focal
consolidation. No pleural effusion or pneumothorax.

The heart is normal in size.
IMPRESSION: No evidence of acute cardiopulmonary disease.

## 2015-11-12 ENCOUNTER — Emergency Department (HOSPITAL_COMMUNITY)
Admission: EM | Admit: 2015-11-12 | Discharge: 2015-11-12 | Disposition: A | Discharge status: Home / Self Care | Payer: Medicare Other | Attending: Emergency Medicine | Admitting: Emergency Medicine

## 2015-11-12 ENCOUNTER — Encounter (HOSPITAL_COMMUNITY): Payer: Self-pay | Admitting: Family Medicine

## 2015-11-12 DIAGNOSIS — N390 Urinary tract infection, site not specified: Secondary | ICD-10-CM | POA: Insufficient documentation

## 2015-11-12 DIAGNOSIS — Z7952 Long term (current) use of systemic steroids: Secondary | ICD-10-CM | POA: Insufficient documentation

## 2015-11-12 DIAGNOSIS — I10 Essential (primary) hypertension: Secondary | ICD-10-CM | POA: Diagnosis not present

## 2015-11-12 DIAGNOSIS — Z79899 Other long term (current) drug therapy: Secondary | ICD-10-CM | POA: Diagnosis not present

## 2015-11-12 DIAGNOSIS — R339 Retention of urine, unspecified: Secondary | ICD-10-CM | POA: Diagnosis present

## 2015-11-12 DIAGNOSIS — H409 Unspecified glaucoma: Secondary | ICD-10-CM | POA: Diagnosis not present

## 2015-11-12 DIAGNOSIS — Z8673 Personal history of transient ischemic attack (TIA), and cerebral infarction without residual deficits: Secondary | ICD-10-CM | POA: Diagnosis not present

## 2015-11-12 DIAGNOSIS — Z466 Encounter for fitting and adjustment of urinary device: Secondary | ICD-10-CM | POA: Diagnosis not present

## 2015-11-12 DIAGNOSIS — J439 Emphysema, unspecified: Secondary | ICD-10-CM | POA: Insufficient documentation

## 2015-11-12 DIAGNOSIS — Z792 Long term (current) use of antibiotics: Secondary | ICD-10-CM | POA: Insufficient documentation

## 2015-11-12 DIAGNOSIS — N400 Enlarged prostate without lower urinary tract symptoms: Secondary | ICD-10-CM | POA: Diagnosis not present

## 2015-11-12 DIAGNOSIS — Z7982 Long term (current) use of aspirin: Secondary | ICD-10-CM | POA: Diagnosis not present

## 2015-11-12 DIAGNOSIS — Z87891 Personal history of nicotine dependence: Secondary | ICD-10-CM | POA: Diagnosis not present

## 2015-11-12 LAB — URINALYSIS, ROUTINE W REFLEX MICROSCOPIC
Bilirubin Urine: NEGATIVE
GLUCOSE, UA: NEGATIVE mg/dL
KETONES UR: NEGATIVE mg/dL
Nitrite: POSITIVE — AB
PROTEIN: 100 mg/dL — AB
Specific Gravity, Urine: 1.014 (ref 1.005–1.030)
pH: 8.5 — ABNORMAL HIGH (ref 5.0–8.0)

## 2015-11-12 LAB — CBC WITH DIFFERENTIAL/PLATELET
BASOS ABS: 0 10*3/uL (ref 0.0–0.1)
BASOS PCT: 0 %
EOS PCT: 1 %
Eosinophils Absolute: 0 10*3/uL (ref 0.0–0.7)
HEMATOCRIT: 46.3 % (ref 39.0–52.0)
Hemoglobin: 14.6 g/dL (ref 13.0–17.0)
Lymphocytes Relative: 18 %
Lymphs Abs: 1.1 10*3/uL (ref 0.7–4.0)
MCH: 28 pg (ref 26.0–34.0)
MCHC: 31.5 g/dL (ref 30.0–36.0)
MCV: 88.7 fL (ref 78.0–100.0)
MONO ABS: 0.8 10*3/uL (ref 0.1–1.0)
MONOS PCT: 13 %
NEUTROS ABS: 4.2 10*3/uL (ref 1.7–7.7)
Neutrophils Relative %: 68 %
PLATELETS: 167 10*3/uL (ref 150–400)
RBC: 5.22 MIL/uL (ref 4.22–5.81)
RDW: 15.5 % (ref 11.5–15.5)
WBC: 6.1 10*3/uL (ref 4.0–10.5)

## 2015-11-12 LAB — URINE MICROSCOPIC-ADD ON: SQUAMOUS EPITHELIAL / LPF: NONE SEEN

## 2015-11-12 LAB — I-STAT CHEM 8, ED
BUN: 19 mg/dL (ref 6–20)
CREATININE: 1.2 mg/dL (ref 0.61–1.24)
Calcium, Ion: 1.17 mmol/L (ref 1.13–1.30)
Chloride: 101 mmol/L (ref 101–111)
GLUCOSE: 131 mg/dL — AB (ref 65–99)
HEMATOCRIT: 50 % (ref 39.0–52.0)
HEMOGLOBIN: 17 g/dL (ref 13.0–17.0)
POTASSIUM: 4.1 mmol/L (ref 3.5–5.1)
Sodium: 140 mmol/L (ref 135–145)
TCO2: 24 mmol/L (ref 0–100)

## 2015-11-12 MED ORDER — FOSFOMYCIN TROMETHAMINE 3 G PO PACK
3.0000 g | PACK | Freq: Once | ORAL | Status: AC
  Administered 2015-11-12: 3 g via ORAL
  Filled 2015-11-12: qty 3

## 2015-11-12 NOTE — ED Notes (Signed)
Pt has a foley catheter that was changed on Friday by urologist office. Pt's family member reports urine is leaking around the catheter tubing. Also, reports he has had less urine output than what he normally would have at this time of the hour.

## 2015-11-12 NOTE — ED Provider Notes (Signed)
CSN: [id number]     Arrival date & time 11/12/15  0319 History  By signing my name below, I, Jesus Carey, attest that this documentation has been prepared under the direction and in the presence of Rohail Klees, MD. Electronically Signed: Budd Carey, ED Scribe. 11/12/2015. 4:00 AM.     Chief Complaint  Patient presents with  . Urinary Retention   Patient is a 82 y.o. male presenting with dysuria. The history is provided by the patient and a relative. No language interpreter was used.  Dysuria This is a recurrent problem. The current episode started 1 to 2 hours ago. The problem occurs constantly. The problem has not changed since onset.Pertinent negatives include no chest pain, no headaches and no shortness of breath. Nothing aggravates the symptoms. He has tried nothing for the symptoms. The treatment provided no relief.   HPI Comments: Jesus Carey is a 82 y.o. male former smoker with a PMHX of CVA, COPD, emphysema, and HTN who presents to the Emergency Department complaining of urinary retention onset tonight. Per relative, pt's foley catheter was changed out 3 days ago by his urologist. She notes pt has had reduced urine output and suspected urinary retention, as well as leakage around the catheter tubing.   Past Medical History  Diagnosis Date  . Hypertension   . Glaucoma   . Emphysema lung (HCC)   . COPD (chronic obstructive pulmonary disease) (HCC)   . CVA (cerebral infarction)   . BPH (benign prostatic hyperplasia)    History reviewed. No pertinent past surgical history. History reviewed. No pertinent family history. Social History  Substance Use Topics  . Smoking status: Former Smoker -- 0.50 packs/day for 20 years    Types: Cigarettes    Quit date: 04/26/1995  . Smokeless tobacco: Never Used  . Alcohol Use: No    Review of Systems  Respiratory: Negative for shortness of breath.   Cardiovascular: Negative for chest pain.  Genitourinary: Positive for dysuria,  decreased urine volume and difficulty urinating.  Neurological: Negative for headaches.  All other systems reviewed and are negative.   Allergies  Review of patient's allergies indicates no known allergies.  Home Medications   Prior to Admission medications   Medication Sig Start Date End Date Taking? Authorizing Provider  amLODipine (NORVASC) 10 MG tablet Take 1 tablet (10 mg total) by mouth daily. 10/22/15   Roxy Horseman, PA-C  aspirin EC 81 MG tablet Take 81 mg by mouth daily.    Historical Provider, MD  brimonidine (ALPHAGAN) 0.2 % ophthalmic solution Place 1 drop into the right eye 3 (three) times daily.     Historical Provider, MD  brinzolamide (AZOPT) 1 % ophthalmic suspension Place 1 drop into both eyes 3 (three) times daily.     Historical Provider, MD  cephALEXin (KEFLEX) 500 MG capsule Take 1 capsule (500 mg total) by mouth 2 (two) times daily. 10/14/15   Mady Gemma, PA-C  diclofenac sodium (VOLTAREN) 1 % GEL Apply 2 g topically 4 (four) times daily. Patient not taking: Reported on 10/14/2015 09/01/15   Charm Rings, MD  ipratropium-albuterol (DUONEB) 0.5-2.5 (3) MG/3ML SOLN Take 3 mLs by nebulization 4 (four) times daily. 05/28/15   Coralyn Helling, MD  predniSONE (DELTASONE) 10 MG tablet Take 10 mg by mouth daily with breakfast.    Historical Provider, MD  predniSONE (DELTASONE) 10 MG tablet take 1 tablet by mouth once daily with BREAKFAST 10/31/15   Michele Mcalpine, MD  rosuvastatin (CRESTOR) 10 MG  tablet Take 1 tablet (10 mg total) by mouth daily. 10/22/15   Roxy Horseman, PA-C  tamsulosin (FLOMAX) 0.4 MG CAPS capsule Take 1 capsule (0.4 mg total) by mouth daily. 05/12/15   Christina P Rama, MD  travoprost, benzalkonium, (TRAVATAN) 0.004 % ophthalmic solution Place 1 drop into both eyes at bedtime.     Historical Provider, MD   BP 122/87 mmHg  Pulse 95  Temp(Src) 97.7 F (36.5 C) (Oral)  Resp 20  Ht  (1.676 m)  Wt 94 lb (42.638 kg)  BMI 15.18 kg/m2  SpO2  97% Physical Exam  Constitutional: He is oriented to person, place, and time. He appears well-developed and well-nourished. No distress.  HENT:  Head: Normocephalic and atraumatic.  Mouth/Throat: Oropharynx is clear and moist.  Eyes: Conjunctivae are normal. Pupils are equal, round, and reactive to light. Right eye exhibits no discharge. Left eye exhibits no discharge.  Neck: Normal range of motion. Neck supple.  Cardiovascular: Normal rate, regular rhythm and normal heart sounds.   Pulmonary/Chest: Effort normal and breath sounds normal. No respiratory distress.  Abdominal: Soft. Bowel sounds are normal. There is no tenderness. There is no rebound and no guarding.  Musculoskeletal: Normal range of motion.  Neurological: He is alert and oriented to person, place, and time. Coordination normal.  Skin: Skin is warm and dry. No rash noted. He is not diaphoretic. No erythema.  Psychiatric: He has a normal mood and affect.  Nursing note and vitals reviewed.   ED Course  Procedures  DIAGNOSTIC STUDIES: Oxygen Saturation is 97% on RA, normal by my interpretation.    COORDINATION OF CARE: 3:58 AM - Discussed plans to flush catheter and possibly replace it. Pt advised of plan for treatment and pt agrees.  Labs Review Labs Reviewed - No data to display  Imaging Review No results found. I have personally reviewed and evaluated these images and lab results as part of my medical decision-making.   EKG Interpretation None      MDM   Final diagnoses:  None    Foley replaced.  One dose for fosfomycin given for urine.  Strict return precautions for weakness, fever, vomiting or any concerns.  Follow up with urology as directed.    I personally performed the services described in this documentation, which was scribed in my presence. The recorded information has been reviewed and is accurate.     Cy Blamer, MD 11/12/15 [phone]

## 2015-11-15 ENCOUNTER — Ambulatory Visit: Payer: Medicare Other | Admitting: Pulmonary Disease

## 2015-11-29 DIAGNOSIS — Z8673 Personal history of transient ischemic attack (TIA), and cerebral infarction without residual deficits: Secondary | ICD-10-CM | POA: Insufficient documentation

## 2015-11-29 DIAGNOSIS — I10 Essential (primary) hypertension: Secondary | ICD-10-CM | POA: Insufficient documentation

## 2015-11-29 DIAGNOSIS — N400 Enlarged prostate without lower urinary tract symptoms: Secondary | ICD-10-CM | POA: Insufficient documentation

## 2015-11-29 DIAGNOSIS — Z79899 Other long term (current) drug therapy: Secondary | ICD-10-CM | POA: Diagnosis not present

## 2015-11-29 DIAGNOSIS — Z7952 Long term (current) use of systemic steroids: Secondary | ICD-10-CM | POA: Diagnosis not present

## 2015-11-29 DIAGNOSIS — Z87891 Personal history of nicotine dependence: Secondary | ICD-10-CM | POA: Diagnosis not present

## 2015-11-29 DIAGNOSIS — J439 Emphysema, unspecified: Secondary | ICD-10-CM | POA: Diagnosis not present

## 2015-11-29 DIAGNOSIS — Z7982 Long term (current) use of aspirin: Secondary | ICD-10-CM | POA: Insufficient documentation

## 2015-11-29 DIAGNOSIS — H409 Unspecified glaucoma: Secondary | ICD-10-CM | POA: Insufficient documentation

## 2015-11-29 DIAGNOSIS — R339 Retention of urine, unspecified: Secondary | ICD-10-CM | POA: Diagnosis present

## 2015-11-30 ENCOUNTER — Encounter (HOSPITAL_COMMUNITY): Payer: Self-pay | Admitting: *Deleted

## 2015-11-30 ENCOUNTER — Emergency Department (HOSPITAL_COMMUNITY)
Admission: EM | Admit: 2015-11-30 | Discharge: 2015-11-30 | Disposition: A | Discharge status: Home / Self Care | Payer: Medicare Other | Attending: Emergency Medicine | Admitting: Emergency Medicine

## 2015-11-30 ENCOUNTER — Telehealth: Payer: Self-pay | Admitting: Pulmonary Disease

## 2015-11-30 DIAGNOSIS — R339 Retention of urine, unspecified: Secondary | ICD-10-CM

## 2015-11-30 MED ORDER — IPRATROPIUM-ALBUTEROL 0.5-2.5 (3) MG/3ML IN SOLN: 3.0000 mL | Freq: Four times a day (QID) | RESPIRATORY_TRACT | Status: AC

## 2015-11-30 MED ORDER — CEPHALEXIN 500 MG PO CAPS: 1000.0000 mg | ORAL_CAPSULE | Freq: Two times a day (BID) | ORAL | Status: DC

## 2015-11-30 NOTE — ED Notes (Signed)
Patient is alert and oriented x3.  He was given DC instructions and follow up visit instructions.  Patient gave verbal understanding.  He was DC ambulatory under his own power to home.  V/S stable.  He was not showing any signs of distress on DC 

## 2015-11-30 NOTE — Telephone Encounter (Signed)
Spoke with pt's son Cherylann Ratel, states that pt is having difficulty getting pt's albuterol rx through pharmacy.  [facility] [address].  Line was cut off while talking to pt's son.  Called pharmacy, states that regular [employer] will not cover Duoneb, but a DWO can be filled out to bill through Medicare part B.  DWO faxed to our office- will await fax.

## 2015-11-30 NOTE — Discharge Instructions (Signed)
Acute Urinary Retention, Male  Acute urinary retention is when you are unable to pee (urinate). Acute urinary retention is common in older men. Prostates can get bigger, which blocks the flow of pee.   HOME CARE  · Drink enough fluids to keep your pee clear or pale yellow.  · If you are sent home with a tube that drains the bladder (catheter), there will be a drainage bag attached to it. There are two types of bags. One is big that you can wear at night without having to empty it. One is smaller and needs to be emptied more often.    Keep the drainage bag empty.    Keep the drainage bag lower than your catheter.  · Only take medicine as told by your doctor.  GET HELP IF:  · You have a low-grade fever.  · You have spasms or you are leaking pee when you have spasms.  GET HELP RIGHT AWAY IF:   · You have chills or a fever.  · Your catheter stops draining pee.  · Your catheter falls out.  · You have increased bleeding that does not stop after you have rested and increased the amount of fluids you had been drinking.  MAKE SURE YOU:   · Understand these instructions.  · Will watch your condition.  · Will get help right away if you are not doing well or get worse.     This information is not intended to replace advice given to you by your health care provider. Make sure you discuss any questions you have with your health care provider.     Document Released: 03/17/2008 Document Revised: 02/13/2015 Document Reviewed: 03/10/2013  Elsevier Interactive Patient Education ©2016 [employer]

## 2015-11-30 NOTE — ED Provider Notes (Signed)
CSN: [id number]     Arrival date & time 11/29/15  2358 History   By signing my name below, I, Iona Beard, attest that this documentation has been prepared under the direction and in the presence of.   Electronically Signed: Iona Beard, ED Scribe. 11/30/2015. 12:45 AM     Chief Complaint  Patient presents with  . Urinary Retention   HPI Comments: Jesus Carey is a 82 y.o. male who presents to the Emergency Department complaining of urinary retention, onset earlier tonight. Patient noted suprapubic discomfort and then noticed that urine was leaking around his Foley. Pt states that his symptoms have improved and that he is feeling fine. Pt denies any other pertinent symptoms.  The history is provided by the patient. No language interpreter was used.    Past Medical History  Diagnosis Date  . Hypertension   . Glaucoma   . Emphysema lung (HCC)   . COPD (chronic obstructive pulmonary disease) (HCC)   . CVA (cerebral infarction)   . BPH (benign prostatic hyperplasia)    History reviewed. No pertinent past surgical history. History reviewed. No pertinent family history. Social History  Substance Use Topics  . Smoking status: Former Smoker -- 0.50 packs/day for 20 years    Types: Cigarettes    Quit date: 04/26/1995  . Smokeless tobacco: Never Used  . Alcohol Use: No    Review of Systems  All other systems reviewed and are negative.     Allergies  Review of patient's allergies indicates no known allergies.  Home Medications   Prior to Admission medications   Medication Sig Start Date End Date Taking? Authorizing Provider  amLODipine (NORVASC) 10 MG tablet Take 1 tablet (10 mg total) by mouth daily. 10/22/15   Roxy Horseman, PA-C  aspirin EC 81 MG tablet Take 81 mg by mouth daily.    Historical Provider, MD  brimonidine (ALPHAGAN) 0.2 % ophthalmic solution Place 1 drop into the right eye 3 (three) times daily.     Historical Provider, MD  brinzolamide (AZOPT)  1 % ophthalmic suspension Place 1 drop into both eyes 3 (three) times daily.     Historical Provider, MD  cephALEXin (KEFLEX) 500 MG capsule Take 1 capsule (500 mg total) by mouth 2 (two) times daily. Patient not taking: Reported on 11/12/2015 10/14/15   Mady Gemma, PA-C  diclofenac sodium (VOLTAREN) 1 % GEL Apply 2 g topically 4 (four) times daily. Patient not taking: Reported on 10/14/2015 09/01/15   Charm Rings, MD  ipratropium-albuterol (DUONEB) 0.5-2.5 (3) MG/3ML SOLN Take 3 mLs by nebulization 4 (four) times daily. 05/28/15   Coralyn Helling, MD  predniSONE (DELTASONE) 10 MG tablet take 1 tablet by mouth once daily with BREAKFAST 10/31/15   Michele Mcalpine, MD  rosuvastatin (CRESTOR) 10 MG tablet Take 1 tablet (10 mg total) by mouth daily. 10/22/15   Roxy Horseman, PA-C  tamsulosin (FLOMAX) 0.4 MG CAPS capsule Take 1 capsule (0.4 mg total) by mouth daily. 05/12/15   Christina P Rama, MD  TRAVATAN Z 0.004 % SOLN ophthalmic solution Place 1 drop into both eyes at bedtime. 10/31/15   Historical Provider, MD   There were no vitals taken for this visit. Physical Exam  Constitutional: He is oriented to person, place, and time. He appears well-developed and well-nourished. No distress.  HENT:  Head: Normocephalic and atraumatic.  Right Ear: Hearing normal.  Left Ear: Hearing normal.  Nose: Nose normal.  Mouth/Throat: Oropharynx is clear and moist and mucous  membranes are normal.  Eyes: Conjunctivae and EOM are normal. Pupils are equal, round, and reactive to light.  Neck: Normal range of motion. Neck supple.  Cardiovascular: Regular rhythm, S1 normal and S2 normal.  Exam reveals no gallop and no friction rub.   No murmur heard. Pulmonary/Chest: Effort normal and breath sounds normal. No respiratory distress. He exhibits no tenderness.  Abdominal: Soft. Normal appearance and bowel sounds are normal. There is no hepatosplenomegaly. There is no tenderness. There is no rebound, no guarding, no  tenderness at McBurney's point and negative Murphy's sign. No hernia.  Musculoskeletal: Normal range of motion.  Neurological: He is alert and oriented to person, place, and time. He has normal strength. No cranial nerve deficit or sensory deficit. Coordination normal. GCS eye subscore is 4. GCS verbal subscore is 5. GCS motor subscore is 6.  Skin: Skin is warm, dry and intact. No rash noted. No cyanosis.  Psychiatric: He has a normal mood and affect. His speech is normal and behavior is normal. Thought content normal.  Nursing note and vitals reviewed.   ED Course  Procedures (including critical care time) DIAGNOSTIC STUDIES: Oxygen Saturation is 99% on RA, normal by my interpretation.    COORDINATION OF CARE:  12:46 AM Discussed treatment plan with pt at bedside and pt agreed to plan.   Labs Review Labs Reviewed  URINALYSIS, ROUTINE W REFLEX MICROSCOPIC (NOT AT [facility])    Imaging Review No results found. I have personally reviewed and evaluated these images and lab results as part of my medical decision-making.   EKG Interpretation None      MDM   Final diagnoses:  Urinary retention   Presents to the ER for acute urinary retention. Patient does have chronic indwelling Foley catheter which became blocked with a blood clot. His Foley catheter was replaced and is draining freely now, patient is without complaints.  I personally performed the services described in this documentation, which was scribed in my presence. The recorded information has been reviewed and is accurate.     Gilda Crease, MD 11/30/15 [phone]

## 2015-11-30 NOTE — Telephone Encounter (Signed)
Spoke with pt's son, Cherylann Ratel. They are requesting samples of Duoneb. Advised them that we do not have samples of Duoneb. I have offered to send in a small supply of this to pt's pharmacy. They accepted. Rx has been sent in.

## 2015-11-30 NOTE — ED Notes (Signed)
Patient is alert and oriented x4.  He is complaining of urinary retention that started about an hour ago.   Patient states that he has had a history of this issue.  Patient had a foley with a leg bag present on arrival  That was not draining.  Patient bladder scan was 

## 2015-12-03 NOTE — Telephone Encounter (Signed)
Did not see DWO form. Called form and this is being refaxed over.

## 2015-12-04 NOTE — Telephone Encounter (Signed)
No DWO in SN's folder up front or in cubby.   Called pharmacy, pharmacist states this is being refaxed.  Will look out for fax.

## 2015-12-05 NOTE — Telephone Encounter (Signed)
Form received in Dr Evlyn Courier look at -- this is a Dr Kriste Basque patient. Not sure why this was sent under VS.  Will forward to Dr Jodelle Arynn Armand nurse and give her the Rx order.

## 2015-12-05 NOTE — Telephone Encounter (Signed)
Received form. Will have SN sign once he returns to office.

## 2015-12-05 NOTE — Telephone Encounter (Signed)
We still have not received the DWO form. I have called the pt's pharmacy to have them refax this form. Will await fax.

## 2015-12-11 NOTE — Telephone Encounter (Signed)
Elise please advise when this has been completed.  Thanks!  

## 2015-12-13 ENCOUNTER — Ambulatory Visit (INDEPENDENT_AMBULATORY_CARE_PROVIDER_SITE_OTHER): Payer: Medicare Other | Admitting: Pulmonary Disease

## 2015-12-13 ENCOUNTER — Encounter: Payer: Self-pay | Admitting: Pulmonary Disease

## 2015-12-13 VITALS — BP 102/62 | HR 89 | Ht 66.0 in | Wt 97.2 lb

## 2015-12-13 DIAGNOSIS — R0609 Other forms of dyspnea: Secondary | ICD-10-CM

## 2015-12-13 DIAGNOSIS — R0902 Hypoxemia: Secondary | ICD-10-CM | POA: Diagnosis not present

## 2015-12-13 DIAGNOSIS — J418 Mixed simple and mucopurulent chronic bronchitis: Secondary | ICD-10-CM

## 2015-12-13 NOTE — Telephone Encounter (Signed)
Has this been completed?

## 2015-12-13 NOTE — Assessment & Plan Note (Signed)
Patient with COPD, likely moderate to severe. Unable to perform PFTs. COPD has been stable. Continue nebulizer 4 times a day with DuoNeb. Continue prednisone 10 mg per day. He has been on it since October last year. Does not want  influenza vaccine. Advised patient and son to give [place] a call if he's having respiratory issues sooner rather than later.

## 2015-12-13 NOTE — Assessment & Plan Note (Signed)
Patient with oxygen, 2-3 L with exertion via nasal cannula. Stable. No issues with oxygen.

## 2015-12-13 NOTE — Progress Notes (Signed)
Subjective:    Patient ID: Jesus Carey, male    DOB: 05/22/1920, 82 y.o.   MRN: [id number]  HPI Pt is here for f/u on his copd. since last seen by Dr. Kriste Basque, he has not been to the ER because of his breathing. He has been doing well as far as the COPD is concerned. He uses his nebulizer 4 times a day. He takes prednisone 10 mg a day. He uses the oxygen as needed -- 2-3 L nasal cannula. Usually when he does long walking, he uses oxygen.  Has not been admitted for lung infection since last seen. Has not been on antibiotics for the lungs since last seen.  Copd has been stable.    Review of Systems  Constitutional: Negative.        Has gained 5 pounds since October.  HENT: Negative.   Eyes: Negative.   Respiratory: Positive for shortness of breath. Negative for cough, wheezing and stridor.   Cardiovascular: Negative.   Gastrointestinal: Negative.   Endocrine: Negative.   Genitourinary: Negative.   Musculoskeletal: Negative.   Skin: Negative.   Allergic/Immunologic: Negative.   Neurological: Negative.   Hematological: Negative.   Psychiatric/Behavioral: Negative.   All other systems reviewed and are negative.      Objective:   Physical Exam   Vitals:  Filed Vitals:   12/13/15 1000  BP: 102/62  Pulse: 89  Height:  (1.676 m)  Weight: 97 lb 3.2 oz (44.09 kg)  SpO2: 95%    Constitutional/General:  Pleasant, well-nourished, well-developed, not in any distress,  Comfortably seating.  Chronically ill-looking.  Body mass index is 15.7 kg/(m^2). Wt Readings from Last 3 Encounters:  12/13/15 97 lb 3.2 oz (44.09 kg)  11/12/15 94 lb (42.638 kg)  08/06/15 92 lb 6.4 oz (41.912 kg)      HEENT: Pupils equal and reactive to light and accommodation. Anicteric sclerae. Normal nasal mucosa.   No oral  lesions,  mouth clear,  oropharynx clear, no postnasal drip. (-) Oral thrush. No dental caries.  Airway - Mallampati class III  Neck: No masses. Midline trachea. No JVD,  (-) LAD. (-) bruits appreciated.  Respiratory/Chest: Grossly normal chest. (-) deformity. (-) Accessory muscle use.  Symmetric expansion. (-) Tenderness on palpation.  Resonant on percussion.  Diminished BS on both lower lung zones. (-) wheezing, some crackles at the bases. (-)  rhonchi (-) egophony  Cardiovascular: Regular rate and  rhythm, heart sounds normal, no murmur or gallops, no peripheral edema  Gastrointestinal:  Normal bowel sounds. Soft, non-tender. No hepatosplenomegaly.  (-) masses.   Musculoskeletal:  Normal muscle tone. Normal gait.   Extremities: Grossly normal. (-) clubbing, cyanosis.  (-) edema  Skin: (-) rash,lesions seen.   Neurological/Psychiatric : alert, oriented to time, place, person. Normal mood and affect           Assessment & Plan:  COPD (chronic obstructive pulmonary disease) Patient with COPD, likely moderate to severe. Unable to perform PFTs. COPD has been stable. Continue nebulizer 4 times a day with DuoNeb. Continue prednisone 10 mg per day. He has been on it since October last year. Does not want  influenza vaccine. Advised patient and son to give [place] a call if he's having respiratory issues sooner rather than later.  Exertional dyspnea Patient with exertional dyspnea. Secondary to COPD. Stable. Will observe for now.  Hypoxemia Patient with oxygen, 2-3 L with exertion via nasal cannula. Stable. No issues with oxygen.  RTC in 4 mos  with Dr. Kriste Basque.    Pollie Meyer, MD 12/13/2015, 10:37 AM [facility] Pager [phone] After 3 pm or if no answer, call [phone]

## 2015-12-13 NOTE — Assessment & Plan Note (Signed)
Patient with exertional dyspnea. Secondary to COPD. Stable. Will observe for now.

## 2015-12-13 NOTE — Patient Instructions (Signed)
1. Continue nebulizer use 4 times a day with albuterol. 2. Continue oxygen 2-3 L via nasal cannula with exertion.  Return to clinic in 4 months to see Dr. Bayard Males. Alexis Frock, MD [facility] Pager [phone] After 3pm or if no response, call [phone] Office: [phone], Fax: [phone]

## 2015-12-14 NOTE — Telephone Encounter (Signed)
DWO for [facility] Nothing further needed at this time.

## 2015-12-27 ENCOUNTER — Emergency Department (HOSPITAL_COMMUNITY)
Admission: EM | Admit: 2015-12-27 | Discharge: 2015-12-27 | Disposition: A | Discharge status: Home / Self Care | Payer: Medicare Other | Attending: Emergency Medicine | Admitting: Emergency Medicine

## 2015-12-27 ENCOUNTER — Encounter (HOSPITAL_COMMUNITY): Payer: Self-pay | Admitting: Emergency Medicine

## 2015-12-27 DIAGNOSIS — R319 Hematuria, unspecified: Secondary | ICD-10-CM | POA: Insufficient documentation

## 2015-12-27 DIAGNOSIS — Z87891 Personal history of nicotine dependence: Secondary | ICD-10-CM | POA: Insufficient documentation

## 2015-12-27 DIAGNOSIS — Z79899 Other long term (current) drug therapy: Secondary | ICD-10-CM | POA: Insufficient documentation

## 2015-12-27 DIAGNOSIS — T83098A Other mechanical complication of other indwelling urethral catheter, initial encounter: Secondary | ICD-10-CM | POA: Insufficient documentation

## 2015-12-27 DIAGNOSIS — Z8673 Personal history of transient ischemic attack (TIA), and cerebral infarction without residual deficits: Secondary | ICD-10-CM | POA: Diagnosis not present

## 2015-12-27 DIAGNOSIS — Z792 Long term (current) use of antibiotics: Secondary | ICD-10-CM | POA: Insufficient documentation

## 2015-12-27 DIAGNOSIS — H409 Unspecified glaucoma: Secondary | ICD-10-CM | POA: Insufficient documentation

## 2015-12-27 DIAGNOSIS — T839XXA Unspecified complication of genitourinary prosthetic device, implant and graft, initial encounter: Secondary | ICD-10-CM

## 2015-12-27 DIAGNOSIS — Y658 Other specified misadventures during surgical and medical care: Secondary | ICD-10-CM | POA: Insufficient documentation

## 2015-12-27 DIAGNOSIS — Z7952 Long term (current) use of systemic steroids: Secondary | ICD-10-CM | POA: Insufficient documentation

## 2015-12-27 DIAGNOSIS — Z7982 Long term (current) use of aspirin: Secondary | ICD-10-CM | POA: Insufficient documentation

## 2015-12-27 DIAGNOSIS — I10 Essential (primary) hypertension: Secondary | ICD-10-CM | POA: Diagnosis not present

## 2015-12-27 DIAGNOSIS — N400 Enlarged prostate without lower urinary tract symptoms: Secondary | ICD-10-CM | POA: Insufficient documentation

## 2015-12-27 DIAGNOSIS — J439 Emphysema, unspecified: Secondary | ICD-10-CM | POA: Insufficient documentation

## 2015-12-27 DIAGNOSIS — Z791 Long term (current) use of non-steroidal anti-inflammatories (NSAID): Secondary | ICD-10-CM | POA: Insufficient documentation

## 2015-12-27 LAB — URINALYSIS, ROUTINE W REFLEX MICROSCOPIC
Bilirubin Urine: NEGATIVE
Glucose, UA: NEGATIVE mg/dL
Ketones, ur: NEGATIVE mg/dL
Nitrite: POSITIVE — AB
Specific Gravity, Urine: 1.02 (ref 1.005–1.030)
pH: 7.5 (ref 5.0–8.0)

## 2015-12-27 LAB — URINE MICROSCOPIC-ADD ON: SQUAMOUS EPITHELIAL / LPF: NONE SEEN

## 2015-12-27 NOTE — ED Notes (Signed)
Per patient's son patient has a foley catheter. Patient normally has a small amount of blood that come in the urine. Patient has had an increased amount of blood in urine.

## 2015-12-27 NOTE — ED Notes (Addendum)
PA & MD at bedside.

## 2015-12-27 NOTE — ED Notes (Signed)
Patient's standard foley collection bag changed into leg bag for discharge home.

## 2015-12-27 NOTE — ED Provider Notes (Signed)
CSN: [id number]     Arrival date & time 12/27/15  7829 History   First MD Initiated Contact with Patient 12/27/15 [phone]     Chief Complaint  Patient presents with  . Hematuria     (Consider location/radiation/quality/duration/timing/severity/associated sxs/prior Treatment) HPI Jesus Carey is a 82 y.o. male with hx of HTN, COPD, CVA, BPH, with indwelling foley catheter, presents to ED with complaint of hematuria. Pt states he normally has some blood in his urine. States that last night around 7:30PM, pt developed increased bleeding and "dark blood" in the bag. Pt also has had some bleeding round the foley catheter. Pt's family stated that bag is still draining. Pt reports some pain to the lower abdomen. Denies pain to the penis or scrotum. No injuries to the foley catheter or abdomen reported. Pt denies any fever, chills, malaise. Foley placed about a month ago.   Past Medical History  Diagnosis Date  . Hypertension   . Glaucoma   . Emphysema lung (HCC)   . COPD (chronic obstructive pulmonary disease) (HCC)   . CVA (cerebral infarction)   . BPH (benign prostatic hyperplasia)    History reviewed. No pertinent past surgical history. No family history on file. Social History  Substance Use Topics  . Smoking status: Former Smoker -- 0.50 packs/day for 20 years    Types: Cigarettes    Quit date: 04/26/1995  . Smokeless tobacco: Never Used  . Alcohol Use: No    Review of Systems  Constitutional: Negative for fever and chills.  Respiratory: Negative for cough, chest tightness and shortness of breath.   Cardiovascular: Negative for chest pain, palpitations and leg swelling.  Gastrointestinal: Positive for abdominal pain. Negative for nausea, vomiting, diarrhea and abdominal distention.  Genitourinary: Positive for hematuria. Negative for dysuria, urgency, frequency and penile pain.  Musculoskeletal: Negative for myalgias, arthralgias, neck pain and neck stiffness.  Skin: Negative  for rash.  Neurological: Negative for dizziness, weakness, light-headedness, numbness and headaches.  All other systems reviewed and are negative.     Allergies  Review of patient's allergies indicates no known allergies.  Home Medications   Prior to Admission medications   Medication Sig Start Date End Date Taking? Authorizing Provider  amLODipine (NORVASC) 10 MG tablet Take 1 tablet (10 mg total) by mouth daily. 10/22/15   Roxy Horseman, PA-C  aspirin EC 81 MG tablet Take 81 mg by mouth daily.    Historical Provider, MD  brimonidine (ALPHAGAN) 0.2 % ophthalmic solution Place 1 drop into the right eye 3 (three) times daily.     Historical Provider, MD  brinzolamide (AZOPT) 1 % ophthalmic suspension Place 1 drop into both eyes 3 (three) times daily.     Historical Provider, MD  cephALEXin (KEFLEX) 500 MG capsule Take 2 capsules (1,000 mg total) by mouth 2 (two) times daily. 11/30/15   Gilda Crease, MD  diclofenac sodium (VOLTAREN) 1 % GEL Apply 2 g topically 4 (four) times daily. 09/01/15   Charm Rings, MD  ipratropium-albuterol (DUONEB) 0.5-2.5 (3) MG/3ML SOLN Take 3 mLs by nebulization 4 (four) times daily. 11/30/15   Coralyn Helling, MD  predniSONE (DELTASONE) 10 MG tablet take 1 tablet by mouth once daily with BREAKFAST 10/31/15   Michele Mcalpine, MD  rosuvastatin (CRESTOR) 10 MG tablet Take 1 tablet (10 mg total) by mouth daily. 10/22/15   Roxy Horseman, PA-C  tamsulosin (FLOMAX) 0.4 MG CAPS capsule Take 1 capsule (0.4 mg total) by mouth daily. 05/12/15  Maryruth Bunhristina P Rama, MD  TRAVATAN Z 0.004 % SOLN ophthalmic solution Place 1 drop into both eyes at bedtime. 10/31/15   Historical Provider, MD   BP 102/79 mmHg  Pulse 100  Temp(Src) 97.5 F (36.4 C) (Oral)  Resp 22  Ht 5\' 7"  (1.702 m)  Wt 43.999 kg  BMI 15.19 kg/m2  SpO2 96% Physical Exam  Constitutional: He is oriented to person, place, and time. He appears well-developed and well-nourished. No distress.  HENT:  Head:  Normocephalic and atraumatic.  Eyes: Conjunctivae are normal.  Neck: Neck supple.  Cardiovascular: Normal rate, regular rhythm and normal heart sounds.   Pulmonary/Chest: Effort normal. No respiratory distress. He has no wheezes. He has no rales.  Abdominal: Soft. Bowel sounds are normal. He exhibits no distension. There is no rebound and no guarding.  Mild suprapubic tenderness  Genitourinary:  Foley catheter in place. Mild bleeding around the Foley catheter. No active hemorrhaging. Bloody urine in the bag.  Musculoskeletal: He exhibits no edema.  Neurological: He is alert and oriented to person, place, and time.  Skin: Skin is warm and dry.  Nursing note and vitals reviewed.   ED Course  Procedures (including critical care time) Labs Review Labs Reviewed  URINALYSIS, ROUTINE W REFLEX MICROSCOPIC (NOT [facility]) - Abnormal; Notable for the following:    Color, Urine RED (*)    APPearance TURBID (*)    Hgb urine dipstick LARGE (*)    Protein, ur >300 (*)    Nitrite POSITIVE (*)    Leukocytes, UA SMALL (*)    All other components within normal limits  URINE MICROSCOPIC-ADD ON - Abnormal; Notable for the following:    Bacteria, UA FEW (*)    All other components within normal limits  URINE CULTURE    Imaging Review No results found. I have personally reviewed and evaluated these images and lab results as part of my medical decision-making.   EKG Interpretation None      MDM   Final diagnoses:  Foley catheter problem, initial encounter [facility](HCC)  Hematuria   Patient with indwelling Foley catheter for urinary retention due to BPH, and emergency department for increased blood in Foley bag, bleeding around the catheter. On my exam, patient does have some trace blood around urethral meatus around the Foley catheter. There is trace bloody urine in the back. Scrotum nontender. Mild suprapubic tenderness on exam. Patient is afebrile, otherwise nontoxic appearing. We will exchange the  Foley and irrigated.  5:25 AM Pt's foley was replaced by a nurse, irrigated. Draining bloody urine. Will discharge home, will have patient follow up closely  with primary care doctor. Watch closely for any signs of clogged Foley or infection.  Filed Vitals:   12/27/15 0443 12/27/15 0450  BP: 102/79   Pulse: 100   Temp: 97.5 F (36.4 C)   TempSrc: Oral   Resp: 22   Height:  5\' 7"  (1.702 m)  Weight:  43.999 kg  SpO2: 96%      Jaynie Crumbleatyana Kenniel Bergsma, PA-C 12/28/15 0645

## 2015-12-27 NOTE — ED Notes (Signed)
Discharge instructions and follow up care reviewed with patient and patient's son. Patient and patient's son verbalized understanding.

## 2015-12-27 NOTE — Discharge Instructions (Signed)
Follow up with urology. Return if catheter not draining, pain, fever, any new concerning symptom.    Hematuria, Adult Hematuria is blood in your urine. It can be caused by a bladder infection, kidney infection, prostate infection, kidney stone, or cancer of your urinary tract. Infections can usually be treated with medicine, and a kidney stone usually will pass through your urine. If neither of these is the cause of your hematuria, further workup to find out the reason may be needed. It is very important that you tell your health care provider about any blood you see in your urine, even if the blood stops without treatment or happens without causing pain. Blood in your urine that happens and then stops and then happens again can be a symptom of a very serious condition. Also, pain is not a symptom in the initial stages of many urinary cancers. HOME CARE INSTRUCTIONS   Drink lots of fluid, 3-4 quarts a day. If you have been diagnosed with an infection, cranberry juice is especially recommended, in addition to large amounts of water.  Avoid caffeine, tea, and carbonated beverages because they tend to irritate the bladder.  Avoid alcohol because it may irritate the prostate.  Take all medicines as directed by your health care provider.  If you were prescribed an antibiotic medicine, finish it all even if you start to feel better.  If you have been diagnosed with a kidney stone, follow your health care provider's instructions regarding straining your urine to catch the stone.  Empty your bladder often. Avoid holding urine for long periods of time.  After a bowel movement, women should cleanse front to back. Use each tissue only once.  Empty your bladder before and after sexual intercourse if you are a male. SEEK MEDICAL CARE IF:  You develop back pain.  You have a fever.  You have a feeling of sickness in your stomach (nausea) or vomiting.  Your symptoms are not better in 3 days.  Return sooner if you are getting worse. SEEK IMMEDIATE MEDICAL CARE IF:   You develop severe vomiting and are unable to keep the medicine down.  You develop severe back or abdominal pain despite taking your medicines.  You begin passing a large amount of blood or clots in your urine.  You feel extremely weak or faint, or you pass out. MAKE SURE YOU:   Understand these instructions.  Will watch your condition.  Will get help right away if you are not doing well or get worse.   This information is not intended to replace advice given to you by your health care provider. Make sure you discuss any questions you have with your health care provider.   Document Released: 09/29/2005 Document Revised: 10/20/2014 Document Reviewed: 05/30/2013 Elsevier Interactive Patient Education [employer].

## 2015-12-27 NOTE — ED Provider Notes (Signed)
Medical screening examination/treatment/procedure(s) were conducted as a shared visit with non-physician practitioner(s) and myself.  I personally evaluated the patient during the encounter.  5:25 AM Foley in place draining bloody urine. Patient's  Bladder discomfort has been relieved. His bladder is deflated and nontender.       Paula LibraJohn Elliet Goodnow, MD 12/27/15 [phone]

## 2015-12-27 NOTE — ED Notes (Signed)
PA at bedside.

## 2015-12-28 LAB — URINE CULTURE

## 2015-12-31 ENCOUNTER — Ambulatory Visit: Payer: Medicare Other | Admitting: Pulmonary Disease

## 2016-01-05 ENCOUNTER — Emergency Department (HOSPITAL_COMMUNITY)
Admission: EM | Admit: 2016-01-05 | Discharge: 2016-01-05 | Disposition: A | Discharge status: Home / Self Care | Payer: Medicare Other | Attending: Emergency Medicine | Admitting: Emergency Medicine

## 2016-01-05 ENCOUNTER — Encounter (HOSPITAL_COMMUNITY): Payer: Self-pay | Admitting: *Deleted

## 2016-01-05 DIAGNOSIS — Z79899 Other long term (current) drug therapy: Secondary | ICD-10-CM | POA: Insufficient documentation

## 2016-01-05 DIAGNOSIS — J449 Chronic obstructive pulmonary disease, unspecified: Secondary | ICD-10-CM | POA: Insufficient documentation

## 2016-01-05 DIAGNOSIS — Y658 Other specified misadventures during surgical and medical care: Secondary | ICD-10-CM | POA: Diagnosis not present

## 2016-01-05 DIAGNOSIS — Z87891 Personal history of nicotine dependence: Secondary | ICD-10-CM | POA: Insufficient documentation

## 2016-01-05 DIAGNOSIS — Z791 Long term (current) use of non-steroidal anti-inflammatories (NSAID): Secondary | ICD-10-CM | POA: Insufficient documentation

## 2016-01-05 DIAGNOSIS — Z7982 Long term (current) use of aspirin: Secondary | ICD-10-CM | POA: Insufficient documentation

## 2016-01-05 DIAGNOSIS — H409 Unspecified glaucoma: Secondary | ICD-10-CM | POA: Diagnosis not present

## 2016-01-05 DIAGNOSIS — Z7952 Long term (current) use of systemic steroids: Secondary | ICD-10-CM | POA: Insufficient documentation

## 2016-01-05 DIAGNOSIS — Z8673 Personal history of transient ischemic attack (TIA), and cerebral infarction without residual deficits: Secondary | ICD-10-CM | POA: Insufficient documentation

## 2016-01-05 DIAGNOSIS — N400 Enlarged prostate without lower urinary tract symptoms: Secondary | ICD-10-CM | POA: Diagnosis not present

## 2016-01-05 DIAGNOSIS — T83098A Other mechanical complication of other indwelling urethral catheter, initial encounter: Secondary | ICD-10-CM | POA: Diagnosis present

## 2016-01-05 DIAGNOSIS — T83091A Other mechanical complication of indwelling urethral catheter, initial encounter: Secondary | ICD-10-CM

## 2016-01-05 DIAGNOSIS — Z792 Long term (current) use of antibiotics: Secondary | ICD-10-CM | POA: Diagnosis not present

## 2016-01-05 DIAGNOSIS — I10 Essential (primary) hypertension: Secondary | ICD-10-CM | POA: Diagnosis not present

## 2016-01-05 NOTE — ED Notes (Signed)
Bladders scan-[id number]

## 2016-01-05 NOTE — ED Notes (Signed)
Per pt and son foley cath was clogged this morning, son reports urine was leaking around toobing, pt reports pain and pressure in bladder/lower abdomen; urine present in leg bag

## 2016-01-05 NOTE — Discharge Instructions (Signed)
Call your urologist, likely they will want to see you as scheduled.

## 2016-01-05 NOTE — ED Provider Notes (Signed)
CSN: [id number]     Arrival date & time 01/05/16  0945 History   First MD Initiated Contact with Patient 01/05/16 [phone]     Chief Complaint  Patient presents with  . urinary catheter issue     (Consider location/radiation/quality/duration/timing/severity/associated sxs/prior Treatment) Patient is a 82 y.o. male presenting with general illness. The history is provided by the patient and a relative.  Illness Severity:  Moderate Onset quality:  Sudden Duration:  1 day Timing:  Constant Progression:  Worsening Chronicity:  Recurrent Associated symptoms: abdominal pain (suprapubic)   Associated symptoms: no chest pain, no congestion, no diarrhea, no fever, no headaches, no myalgias, no rash, no shortness of breath and no vomiting    82 yo M With a chief complaint of urinary retention. Patient has a Foley catheter placed for BPH. Has been indwelling for the past month or so. Recently saw urologist couple weeks ago for a similar issue. Patient denies any fevers flank pain. Started having decreased urine output in the bag this morning. Some leakage from around the tube.  Past Medical History  Diagnosis Date  . Hypertension   . Glaucoma   . Emphysema lung (HCC)   . COPD (chronic obstructive pulmonary disease) (HCC)   . CVA (cerebral infarction)   . BPH (benign prostatic hyperplasia)    History reviewed. No pertinent past surgical history. No family history on file. Social History  Substance Use Topics  . Smoking status: Former Smoker -- 0.50 packs/day for 20 years    Types: Cigarettes    Quit date: 04/26/1995  . Smokeless tobacco: Never Used  . Alcohol Use: No    Review of Systems  Constitutional: Negative for fever and chills.  HENT: Negative for congestion and facial swelling.   Eyes: Negative for discharge and visual disturbance.  Respiratory: Negative for shortness of breath.   Cardiovascular: Negative for chest pain and palpitations.  Gastrointestinal: Positive for  abdominal pain (suprapubic). Negative for vomiting and diarrhea.  Genitourinary: Positive for decreased urine volume.  Musculoskeletal: Negative for myalgias and arthralgias.  Skin: Negative for color change and rash.  Neurological: Negative for tremors, syncope and headaches.  Psychiatric/Behavioral: Negative for confusion and dysphoric mood.      Allergies  Review of patient's allergies indicates no known allergies.  Home Medications   Prior to Admission medications   Medication Sig Start Date End Date Taking? Authorizing Provider  amLODipine (NORVASC) 10 MG tablet Take 1 tablet (10 mg total) by mouth daily. 10/22/15   Roxy Horseman, PA-C  aspirin EC 81 MG tablet Take 81 mg by mouth daily.    Historical Provider, MD  brimonidine (ALPHAGAN) 0.2 % ophthalmic solution Place 1 drop into the right eye 3 (three) times daily.     Historical Provider, MD  brinzolamide (AZOPT) 1 % ophthalmic suspension Place 1 drop into both eyes 3 (three) times daily.     Historical Provider, MD  cephALEXin (KEFLEX) 500 MG capsule Take 2 capsules (1,000 mg total) by mouth 2 (two) times daily. 11/30/15   Gilda Crease, MD  diclofenac sodium (VOLTAREN) 1 % GEL Apply 2 g topically 4 (four) times daily. 09/01/15   Charm Rings, MD  ipratropium-albuterol (DUONEB) 0.5-2.5 (3) MG/3ML SOLN Take 3 mLs by nebulization 4 (four) times daily. 11/30/15   Coralyn Helling, MD  predniSONE (DELTASONE) 10 MG tablet take 1 tablet by mouth once daily with BREAKFAST 10/31/15   Michele Mcalpine, MD  rosuvastatin (CRESTOR) 10 MG tablet Take 1 tablet (10  mg total) by mouth daily. 10/22/15   Roxy Horsemanobert Browning, PA-C  tamsulosin (FLOMAX) 0.4 MG CAPS capsule Take 1 capsule (0.4 mg total) by mouth daily. 05/12/15   Christina P Rama, MD  TRAVATAN Z 0.004 % SOLN ophthalmic solution Place 1 drop into both eyes at bedtime. 10/31/15   Historical Provider, MD   BP 136/74 mmHg  Pulse 76  Temp(Src) 97.6 F (36.4 C) (Oral)  Resp 18  SpO2 96% Physical  Exam  Constitutional: He is oriented to person, place, and time. He appears well-developed and well-nourished.  HENT:  Head: Normocephalic and atraumatic.  Eyes: EOM are normal. Pupils are equal, round, and reactive to light.  Neck: Normal range of motion. Neck supple. No JVD present.  Cardiovascular: Normal rate and regular rhythm.  Exam reveals no gallop and no friction rub.   No murmur heard. Pulmonary/Chest: No respiratory distress. He has no wheezes.  Abdominal: He exhibits no distension. There is tenderness (suprapubic). There is no rebound and no guarding.  Genitourinary: Penis normal. No penile tenderness.  Clear urine in bag  Musculoskeletal: Normal range of motion.  Neurological: He is alert and oriented to person, place, and time.  Skin: No rash noted. No pallor.  Psychiatric: He has a normal mood and affect. His behavior is normal.  Nursing note and vitals reviewed.   ED Course  Procedures (including critical care time) Labs Review Labs Reviewed - No data to display  Imaging Review No results found. I have personally reviewed and evaluated these images and lab results as part of my medical decision-making.   EKG Interpretation None      MDM   Final diagnoses:  Complication, blocked Foley catheter, initial encounter [facility](HCC)    82 yo M with a chief complaint of Foley catheter problem. Will irrigate reassess.  Irrigated, patient feels better.  D/c home.  Urology follow up.   I have discussed the diagnosis/risks/treatment options with the patient and family and believe the pt to be eligible for discharge home to follow-up with Urology. We also discussed returning to the ED immediately if new or worsening sx occur. We discussed the sx which are most concerning (e.g., sudden worsening pain, fever, inability to tolerate by mouth) that necessitate immediate return. Medications administered to the patient during their visit and any new prescriptions provided to the patient  are listed below.  Medications given during this visit Medications - No data to display  Discharge Medication List as of 01/05/2016 11:11 AM      The patient appears reasonably screen and/or stabilized for discharge and I doubt any other medical condition or other [facility] requiring further screening, evaluation, or treatment in the ED at this time prior to discharge.    Melene Planan Ryer Asato, DO 01/05/16 1810

## 2016-01-05 NOTE — ED Notes (Signed)
Irrigated f/c with appox of sterile water. Noted clear urine returned. Pt tolerated well.

## 2016-01-05 NOTE — ED Notes (Signed)
Awake. Verbally responsive. A/O x4. Resp even and unlabored. No audible adventitious breath sounds noted. ABC's intact.  

## 2016-01-12 ENCOUNTER — Encounter (HOSPITAL_COMMUNITY): Payer: Self-pay

## 2016-01-12 ENCOUNTER — Emergency Department (HOSPITAL_COMMUNITY)
Admission: EM | Admit: 2016-01-12 | Discharge: 2016-01-12 | Disposition: A | Discharge status: Home / Self Care | Payer: Medicare Other | Attending: Emergency Medicine | Admitting: Emergency Medicine

## 2016-01-12 DIAGNOSIS — Y658 Other specified misadventures during surgical and medical care: Secondary | ICD-10-CM | POA: Diagnosis not present

## 2016-01-12 DIAGNOSIS — H409 Unspecified glaucoma: Secondary | ICD-10-CM | POA: Diagnosis not present

## 2016-01-12 DIAGNOSIS — N390 Urinary tract infection, site not specified: Secondary | ICD-10-CM | POA: Insufficient documentation

## 2016-01-12 DIAGNOSIS — R32 Unspecified urinary incontinence: Secondary | ICD-10-CM | POA: Diagnosis present

## 2016-01-12 DIAGNOSIS — I10 Essential (primary) hypertension: Secondary | ICD-10-CM | POA: Insufficient documentation

## 2016-01-12 DIAGNOSIS — Z8673 Personal history of transient ischemic attack (TIA), and cerebral infarction without residual deficits: Secondary | ICD-10-CM | POA: Insufficient documentation

## 2016-01-12 DIAGNOSIS — T83098A Other mechanical complication of other indwelling urethral catheter, initial encounter: Secondary | ICD-10-CM | POA: Insufficient documentation

## 2016-01-12 DIAGNOSIS — Z7952 Long term (current) use of systemic steroids: Secondary | ICD-10-CM | POA: Diagnosis not present

## 2016-01-12 DIAGNOSIS — N400 Enlarged prostate without lower urinary tract symptoms: Secondary | ICD-10-CM | POA: Insufficient documentation

## 2016-01-12 DIAGNOSIS — Z87891 Personal history of nicotine dependence: Secondary | ICD-10-CM | POA: Insufficient documentation

## 2016-01-12 DIAGNOSIS — J439 Emphysema, unspecified: Secondary | ICD-10-CM | POA: Diagnosis not present

## 2016-01-12 DIAGNOSIS — T839XXA Unspecified complication of genitourinary prosthetic device, implant and graft, initial encounter: Secondary | ICD-10-CM

## 2016-01-12 DIAGNOSIS — Z79899 Other long term (current) drug therapy: Secondary | ICD-10-CM | POA: Insufficient documentation

## 2016-01-12 LAB — URINE MICROSCOPIC-ADD ON

## 2016-01-12 LAB — URINALYSIS, ROUTINE W REFLEX MICROSCOPIC
BILIRUBIN URINE: NEGATIVE
GLUCOSE, UA: NEGATIVE mg/dL
Ketones, ur: NEGATIVE mg/dL
Nitrite: POSITIVE — AB
PH: 7 (ref 5.0–8.0)
Protein, ur: 30 mg/dL — AB
SPECIFIC GRAVITY, URINE: 1.011 (ref 1.005–1.030)

## 2016-01-12 MED ORDER — CEPHALEXIN 500 MG PO CAPS: 500.0000 mg | ORAL_CAPSULE | Freq: Two times a day (BID) | ORAL | Status: AC

## 2016-01-12 NOTE — ED Notes (Signed)
Bed: WA06 Expected date:  Expected time:  Means of arrival:  Comments: 

## 2016-01-12 NOTE — ED Provider Notes (Signed)
CSN: [id number]     Arrival date & time 01/12/16  0636 History   First MD Initiated Contact with Patient 01/12/16 0703     Chief Complaint  Patient presents with  . Urinary Incontinence     (Consider location/radiation/quality/duration/timing/severity/associated sxs/prior Treatment) HPI Comments: Patient presents with suprapubic pain. He has an indwelling Foley catheter that he's had in place for about 4 months. The catheter was most recently changed on March 16 in the emergency department. Per the patient and his son, he catheter hasn't been draining properly during the night and this morning. Patient has increased pain and pressure to his lower abdomen. There is no nausea vomiting. No fevers. He states his lower abdominal pain is gradually been getting worse. There's been no blood in the urine. The son notes the urine has been clear.   Past Medical History  Diagnosis Date  . Hypertension   . Glaucoma   . Emphysema lung (HCC)   . COPD (chronic obstructive pulmonary disease) (HCC)   . CVA (cerebral infarction)   . BPH (benign prostatic hyperplasia)    History reviewed. No pertinent past surgical history. History reviewed. No pertinent family history. Social History  Substance Use Topics  . Smoking status: Former Smoker -- 0.50 packs/day for 20 years    Types: Cigarettes    Quit date: 04/26/1995  . Smokeless tobacco: Never Used  . Alcohol Use: No    Review of Systems  Constitutional: Negative for fever, chills, diaphoresis and fatigue.  HENT: Negative for congestion, rhinorrhea and sneezing.   Eyes: Negative.   Respiratory: Negative for cough, chest tightness and shortness of breath.   Cardiovascular: Negative for chest pain and leg swelling.  Gastrointestinal: Positive for abdominal pain. Negative for nausea, vomiting, diarrhea and blood in stool.  Genitourinary: Positive for dysuria. Negative for frequency, hematuria, flank pain and difficulty urinating.  Musculoskeletal:  Negative for back pain and arthralgias.  Skin: Negative for rash.  Neurological: Negative for dizziness, speech difficulty, weakness, numbness and headaches.      Allergies  Review of patient's allergies indicates no known allergies.  Home Medications   Prior to Admission medications   Medication Sig Start Date End Date Taking? Authorizing Provider  amLODipine (NORVASC) 10 MG tablet Take 1 tablet (10 mg total) by mouth daily. 10/22/15  Yes Roxy Horseman, PA-C  brimonidine (ALPHAGAN) 0.2 % ophthalmic solution Place 1 drop into both eyes 3 (three) times daily.    Yes Historical Provider, MD  brinzolamide (AZOPT) 1 % ophthalmic suspension Place 1 drop into both eyes 3 (three) times daily.    Yes Historical Provider, MD  ipratropium-albuterol (DUONEB) 0.5-2.5 (3) MG/3ML SOLN Take 3 mLs by nebulization 4 (four) times daily. 11/30/15  Yes Coralyn Helling, MD  predniSONE (DELTASONE) 10 MG tablet take 1 tablet by mouth once daily with BREAKFAST 10/31/15  Yes Michele Mcalpine, MD  rosuvastatin (CRESTOR) 10 MG tablet Take 1 tablet (10 mg total) by mouth daily. 10/22/15  Yes Roxy Horseman, PA-C  TRAVATAN Z 0.004 % SOLN ophthalmic solution Place 1 drop into both eyes at bedtime. 10/31/15  Yes Historical Provider, MD  brimonidine (ALPHAGAN) 0.15 % ophthalmic solution Place 1 drop into both eyes 3 (three) times daily.  10/30/15   Historical Provider, MD  cephALEXin (KEFLEX) 500 MG capsule Take 1 capsule (500 mg total) by mouth 2 (two) times daily. 01/12/16   Rolan Bucco, MD  diclofenac sodium (VOLTAREN) 1 % GEL Apply 2 g topically 4 (four) times daily. Patient not  taking: Reported on 01/12/2016 09/01/15   Charm RingsErin J Honig, MD  finasteride (PROSCAR) 5 MG tablet Take 5 mg by mouth daily. 12/27/15   Historical Provider, MD  tamsulosin (FLOMAX) 0.4 MG CAPS capsule Take 1 capsule (0.4 mg total) by mouth daily. Patient not taking: Reported on 01/12/2016 05/12/15   Maryruth Bunhristina P Rama, MD   BP 128/85 mmHg  Pulse 100  Temp(Src)  97.4 F (36.3 C) (Oral)  Resp 22  SpO2 98% Physical Exam  Constitutional: He is oriented to person, place, and time. He appears well-developed and well-nourished.  HENT:  Head: Normocephalic and atraumatic.  Eyes: Pupils are equal, round, and reactive to light.  Neck: Normal range of motion. Neck supple.  Cardiovascular: Normal rate, regular rhythm and normal heart sounds.   Pulmonary/Chest: Effort normal and breath sounds normal. No respiratory distress. He has no wheezes. He has no rales. He exhibits no tenderness.  Abdominal: Soft. Bowel sounds are normal. There is tenderness (moderate TTP to suprapubic area). There is no rebound and no guarding.  Genitourinary:  Normal circumcised male penis.  No bleeding.  Foley in place with clear urine in bag  Musculoskeletal: Normal range of motion. He exhibits no edema.  Lymphadenopathy:    He has no cervical adenopathy.  Neurological: He is alert and oriented to person, place, and time.  Skin: Skin is warm and dry. No rash noted.  Psychiatric: He has a normal mood and affect.    ED Course  Procedures (including critical care time) Labs Review Labs Reviewed  URINALYSIS, ROUTINE W REFLEX MICROSCOPIC (NOT AT [facility]) - Abnormal; Notable for the following:    APPearance CLOUDY (*)    Hgb urine dipstick MODERATE (*)    Protein, ur 30 (*)    Nitrite POSITIVE (*)    Leukocytes, UA LARGE (*)    All other components within normal limits  URINE MICROSCOPIC-ADD ON - Abnormal; Notable for the following:    Squamous Epithelial / LPF 0-5 (*)    Bacteria, UA MANY (*)    All other components within normal limits  URINE CULTURE    Imaging Review No results found. I have personally reviewed and evaluated these images and lab results as part of my medical decision-making.   EKG Interpretation None      MDM   Final diagnoses:  Foley catheter problem, initial encounter [facility](HCC)  UTI (lower urinary tract infection)    Patient presents with a  clogged Foley catheter. It was irrigated but this was unsuccessful in relieving the blockage. A new catheter was placed. It is draining normally. A new urine specimen was obtained following the placement of the new catheter. This does show evidence of a UTI. Will treat with Keflex. Urine culture sent. He has an upcoming appointments with urology on April 11. Return precautions were given.    Rolan BuccoMelanie Olla Delancey, MD 01/12/16 [phone]

## 2016-01-12 NOTE — ED Notes (Signed)
Switched foley bag to leg bag. Pt tolerated well.

## 2016-01-12 NOTE — ED Notes (Signed)
Pt has a leak in his catheter again, he had the same two weeks ago and the catheter was flushed, not changed

## 2016-01-12 NOTE — ED Notes (Signed)
Attempted to irrigate f/c and was unsuccessful. Pt tolerated well. F/C removed.

## 2016-01-12 NOTE — ED Notes (Signed)
Awake. Verbally responsive. A/O x4. Resp even and unlabored. No audible adventitious breath sounds noted. ABC's intact.  

## 2016-01-12 NOTE — ED Notes (Signed)
Bladder scan 

## 2016-01-14 LAB — URINE CULTURE

## 2016-02-07 ENCOUNTER — Ambulatory Visit: Payer: Medicare Other | Admitting: Pulmonary Disease

## 2016-02-12 ENCOUNTER — Encounter (HOSPITAL_COMMUNITY): Payer: Self-pay | Admitting: Emergency Medicine

## 2016-02-12 ENCOUNTER — Emergency Department (HOSPITAL_COMMUNITY)
Admission: EM | Admit: 2016-02-12 | Discharge: 2016-02-12 | Disposition: A | Discharge status: Home / Self Care | Payer: Medicare Other | Attending: Emergency Medicine | Admitting: Emergency Medicine

## 2016-02-12 DIAGNOSIS — Z7951 Long term (current) use of inhaled steroids: Secondary | ICD-10-CM | POA: Diagnosis not present

## 2016-02-12 DIAGNOSIS — N390 Urinary tract infection, site not specified: Secondary | ICD-10-CM | POA: Diagnosis not present

## 2016-02-12 DIAGNOSIS — Z79899 Other long term (current) drug therapy: Secondary | ICD-10-CM | POA: Insufficient documentation

## 2016-02-12 DIAGNOSIS — Z87891 Personal history of nicotine dependence: Secondary | ICD-10-CM | POA: Diagnosis not present

## 2016-02-12 DIAGNOSIS — Z7952 Long term (current) use of systemic steroids: Secondary | ICD-10-CM | POA: Insufficient documentation

## 2016-02-12 DIAGNOSIS — M549 Dorsalgia, unspecified: Secondary | ICD-10-CM | POA: Diagnosis present

## 2016-02-12 DIAGNOSIS — J449 Chronic obstructive pulmonary disease, unspecified: Secondary | ICD-10-CM | POA: Diagnosis not present

## 2016-02-12 DIAGNOSIS — Z8673 Personal history of transient ischemic attack (TIA), and cerebral infarction without residual deficits: Secondary | ICD-10-CM | POA: Diagnosis not present

## 2016-02-12 DIAGNOSIS — I10 Essential (primary) hypertension: Secondary | ICD-10-CM | POA: Diagnosis not present

## 2016-02-12 DIAGNOSIS — Z792 Long term (current) use of antibiotics: Secondary | ICD-10-CM | POA: Diagnosis not present

## 2016-02-12 LAB — CBC WITH DIFFERENTIAL/PLATELET
BASOS PCT: 0 %
Basophils Absolute: 0 10*3/uL (ref 0.0–0.1)
EOS PCT: 0 %
Eosinophils Absolute: 0 10*3/uL (ref 0.0–0.7)
HEMATOCRIT: 39 % (ref 39.0–52.0)
Hemoglobin: 13.1 g/dL (ref 13.0–17.0)
LYMPHS ABS: 0.9 10*3/uL (ref 0.7–4.0)
Lymphocytes Relative: 7 %
MCH: 27.5 pg (ref 26.0–34.0)
MCHC: 33.6 g/dL (ref 30.0–36.0)
MCV: 81.9 fL (ref 78.0–100.0)
MONO ABS: 1.3 10*3/uL — AB (ref 0.1–1.0)
Monocytes Relative: 10 %
Neutro Abs: 11 10*3/uL — ABNORMAL HIGH (ref 1.7–7.7)
Neutrophils Relative %: 83 %
Platelets: 136 10*3/uL — ABNORMAL LOW (ref 150–400)
RBC: 4.76 MIL/uL (ref 4.22–5.81)
RDW: 14.5 % (ref 11.5–15.5)
WBC: 13.2 10*3/uL — ABNORMAL HIGH (ref 4.0–10.5)

## 2016-02-12 LAB — URINE MICROSCOPIC-ADD ON

## 2016-02-12 LAB — BASIC METABOLIC PANEL
ANION GAP: 12 (ref 5–15)
BUN: 26 mg/dL — AB (ref 6–20)
CHLORIDE: 102 mmol/L (ref 101–111)
CO2: 21 mmol/L — ABNORMAL LOW (ref 22–32)
Calcium: 8.6 mg/dL — ABNORMAL LOW (ref 8.9–10.3)
Creatinine, Ser: 1.25 mg/dL — ABNORMAL HIGH (ref 0.61–1.24)
GFR, EST AFRICAN AMERICAN: 55 mL/min — AB (ref >60)
GFR, EST NON AFRICAN AMERICAN: 47 mL/min — AB (ref >60)
Glucose, Bld: 169 mg/dL — ABNORMAL HIGH (ref 65–99)
POTASSIUM: 4 mmol/L (ref 3.5–5.1)
SODIUM: 135 mmol/L (ref 135–145)

## 2016-02-12 LAB — URINALYSIS, ROUTINE W REFLEX MICROSCOPIC
BILIRUBIN URINE: NEGATIVE
Glucose, UA: NEGATIVE mg/dL
Ketones, ur: NEGATIVE mg/dL
Nitrite: POSITIVE — AB
PH: 7 (ref 5.0–8.0)
Protein, ur: 100 mg/dL — AB
SPECIFIC GRAVITY, URINE: 1.014 (ref 1.005–1.030)

## 2016-02-12 LAB — I-STAT CG4 LACTIC ACID, ED
LACTIC ACID, VENOUS: 1.54 mmol/L (ref 0.5–2.0)
Lactic Acid, Venous: 1.21 mmol/L (ref 0.5–2.0)

## 2016-02-12 MED ORDER — DEXTROSE 5 % IV SOLN
1.0000 g | Freq: Once | INTRAVENOUS | Status: AC
  Administered 2016-02-12: 1 g via INTRAVENOUS
  Filled 2016-02-12: qty 10

## 2016-02-12 MED ORDER — CEPHALEXIN 500 MG PO CAPS: 500.0000 mg | ORAL_CAPSULE | Freq: Two times a day (BID) | ORAL | Status: AC

## 2016-02-12 MED ORDER — SODIUM CHLORIDE 0.9 % IV BOLUS (SEPSIS)
1000.0000 mL | Freq: Once | INTRAVENOUS | Status: AC
  Administered 2016-02-12: 1000 mL via INTRAVENOUS

## 2016-02-12 NOTE — Discharge Instructions (Signed)

## 2016-02-12 NOTE — ED Provider Notes (Signed)
CSN: [id number]     Arrival date & time 02/12/16  0543 History   First MD Initiated Contact with Patient 02/12/16 [phone]     Chief Complaint  Patient presents with  . Back Pain   HPI  Mr. Jesus Carey is a 82 year old male with past medical history of hypertension, COPD, CVA and BPH presenting with back pain. Onset of back pain was last evening. Pain is located over the left flank. He states that his kidneys hurt. Unable to characterize the pain; states that it just hurts. The pain does not radiate into the lower extremities. Denies exacerbating factors. Denies associated fevers, chills, abdominal pain, nausea, vomiting or hematuria. Denies weakness and numbness in the lower extremities. He reports remaining ambulatory without difficulty. He states that he has a chronic indwelling Foley catheter due to urinary retention. He states that he is unsure how frequently he changes in that his son manages this. He believes has been over a month since his last catheter change. Reports history of kidney infections and states that this feels similar. He has no other complaints today.  Past Medical History  Diagnosis Date  . Hypertension   . Glaucoma   . Emphysema lung (HCC)   . COPD (chronic obstructive pulmonary disease) (HCC)   . CVA (cerebral infarction)   . BPH (benign prostatic hyperplasia)    History reviewed. No pertinent past surgical history. History reviewed. No pertinent family history. Social History  Substance Use Topics  . Smoking status: Former Smoker -- 0.50 packs/day for 20 years    Types: Cigarettes    Quit date: 04/26/1995  . Smokeless tobacco: Never Used  . Alcohol Use: No    Review of Systems  All other systems reviewed and are negative.     Allergies  Review of patient's allergies indicates no known allergies.  Home Medications   Prior to Admission medications   Medication Sig Start Date End Date Taking? Authorizing Provider  amLODipine (NORVASC) 10 MG tablet Take 1 tablet  (10 mg total) by mouth daily. 10/22/15  Yes Roxy Horsemanobert Browning, PA-C  brimonidine (ALPHAGAN) 0.15 % ophthalmic solution Place 1 drop into both eyes 3 (three) times daily.  10/30/15  Yes Historical Provider, MD  brinzolamide (AZOPT) 1 % ophthalmic suspension Place 1 drop into both eyes 3 (three) times daily.    Yes Historical Provider, MD  finasteride (PROSCAR) 5 MG tablet Take 5 mg by mouth daily. 12/27/15  Yes Historical Provider, MD  ipratropium-albuterol (DUONEB) 0.5-2.5 (3) MG/3ML SOLN Take 3 mLs by nebulization 4 (four) times daily. 11/30/15  Yes Coralyn HellingVineet Sood, MD  predniSONE (DELTASONE) 10 MG tablet take 1 tablet by mouth once daily with BREAKFAST 10/31/15  Yes Michele McalpineScott M Nadel, MD  rosuvastatin (CRESTOR) 10 MG tablet Take 1 tablet (10 mg total) by mouth daily. 10/22/15  Yes Roxy Horsemanobert Browning, PA-C  TRAVATAN Z 0.004 % SOLN ophthalmic solution Place 1 drop into both eyes at bedtime. 10/31/15  Yes Historical Provider, MD  cephALEXin (KEFLEX) 500 MG capsule Take 1 capsule (500 mg total) by mouth 2 (two) times daily. Patient not taking: Reported on 02/12/2016 01/12/16   Rolan BuccoMelanie Belfi, MD  cephALEXin (KEFLEX) 500 MG capsule Take 1 capsule (500 mg total) by mouth 2 (two) times daily. 02/12/16   Davieon Stockham, PA-C  diclofenac sodium (VOLTAREN) 1 % GEL Apply 2 g topically 4 (four) times daily. Patient not taking: Reported on 01/12/2016 09/01/15   Charm RingsErin J Honig, MD  tamsulosin (FLOMAX) 0.4 MG CAPS capsule Take 1 capsule (  0.4 mg total) by mouth daily. Patient not taking: Reported on 01/12/2016 05/12/15   Maryruth Bun Rama, MD   BP 111/64 mmHg  Pulse 87  Temp(Src) 99.1 F (37.3 C) (Oral)  Resp 21  SpO2 99% Physical Exam  Constitutional: He appears well-developed and well-nourished. No distress.  Nontoxic-appearing  HENT:  Head: Normocephalic and atraumatic.  Eyes: Conjunctivae are normal. Right eye exhibits no discharge. Left eye exhibits no discharge. No scleral icterus.  Neck: Normal range of motion.  Cardiovascular:  Normal rate and regular rhythm.   Pulmonary/Chest: Effort normal and breath sounds normal. No respiratory distress.  Abdominal: Soft. He exhibits no distension. There is no tenderness.  No CVA tenderness  Musculoskeletal: Normal range of motion.  Patient indicates left flank as source of pain. This area is not tender to palpation. No focal tenderness over the T or L-spine. No bony deformities of the T or L-spine. Full range motion of the spine intact. Patient moves all extremities spontaneously  Neurological: He is alert. Coordination normal.  5/5 strength of the bilateral lower extremities. Sensation to light touch intact throughout.  Skin: Skin is warm and dry.  No rashes over the flank  Psychiatric: He has a normal mood and affect. His behavior is normal.  Nursing note and vitals reviewed.   ED Course  Procedures (including critical care time) Labs Review Labs Reviewed  URINALYSIS, ROUTINE W REFLEX MICROSCOPIC (NOT AT [facility]) - Abnormal; Notable for the following:    Color, Urine AMBER (*)    APPearance TURBID (*)    Hgb urine dipstick LARGE (*)    Protein, ur 100 (*)    Nitrite POSITIVE (*)    Leukocytes, UA LARGE (*)    All other components within normal limits  CBC WITH DIFFERENTIAL/PLATELET - Abnormal; Notable for the following:    WBC 13.2 (*)    Platelets 136 (*)    Neutro Abs 11.0 (*)    Monocytes Absolute 1.3 (*)    All other components within normal limits  BASIC METABOLIC PANEL - Abnormal; Notable for the following:    CO2 21 (*)    Glucose, Bld 169 (*)    BUN 26 (*)    Creatinine, Ser 1.25 (*)    Calcium 8.6 (*)    GFR calc non Af Amer 47 (*)    GFR calc Af Amer 55 (*)    All other components within normal limits  URINE MICROSCOPIC-ADD ON - Abnormal; Notable for the following:    Squamous Epithelial / LPF 0-5 (*)    Bacteria, UA MANY (*)    All other components within normal limits  URINE CULTURE  I-STAT CG4 LACTIC ACID, ED  I-STAT CG4 LACTIC ACID, ED     Imaging Review No results found. I have personally reviewed and evaluated these images and lab results as part of my medical decision-making.   EKG Interpretation   Date/Time:  Tuesday Feb 12 2016 07:20:56 EDT Ventricular Rate:  96 PR Interval:  149 QRS Duration: 104 QT Interval:  342 QTC Calculation: 432 R Axis:   30 Text Interpretation:  Sinus rhythm Ventricular tachycardia, unsustained  Abnormal T, consider ischemia, diffuse leads Confirmed by ALLEN  MD,  ANTHONY ([id number]) on 02/12/2016 8:00:22 AM      MDM   Final diagnoses:  UTI (lower urinary tract infection)   82 year old male presenting with left-sided flank pain times one day. History of chronic indwelling catheter which has likely not been changed for a month. Afebrile and  hemodynamically stable. Nontoxic-appearing. Abdomen is soft, nontender without peritoneal signs. No CVA tenderness. No tenderness to palpation over the left flank. No bony deformities. Leukocytosis. Creatinine elevated. Lactic acid 2 without elevation. Urinalysis positive for infection. Ceftriaxone and bolus given in emergency department. Patient does not meet admission criteria enters requesting discharge home. Will discharge with Keflex and close PCP follow-up.  At this time there does not appear to be any evidence of an acute emergency medical condition and the patient appears stable for discharge with appropriate outpatient follow up. Diagnosis was discussed with patient who verbalizes understanding and is agreeable to discharge. Pt case discussed with Dr. [name] who agrees with my plan. Return precautions given in discharge paperwork and discussed with pt at bedside. Pt is stable for discharge.     Rolm Gala Miguelina Fore, PA-C 02/12/16 1443  Lorre Nick, MD 02/15/16 1401

## 2016-02-12 NOTE — ED Notes (Signed)
RN is starting an IV line and drawing blood work 

## 2016-02-12 NOTE — ED Notes (Signed)
Pt reports to EMS from home with son for severe back pain; pt states "my kidneys hurt"; pt has foley catheter in place because he "couldn't urinate 5 months ago"; pt's son states pt is still able to ambulate.

## 2016-02-12 NOTE — ED Provider Notes (Signed)
Medical screening examination/treatment/procedure(s) were conducted as a shared visit with non-physician practitioner(s) and myself.  I personally evaluated the patient during the encounter.   EKG Interpretation   Date/Time:  Tuesday Feb 12 2016 07:20:56 EDT Ventricular Rate:  96 PR Interval:  149 QRS Duration: 104 QT Interval:  342 QTC Calculation: 432 R Axis:   30 Text Interpretation:  Sinus rhythm Ventricular tachycardia, unsustained  Abnormal T, consider ischemia, diffuse leads Confirmed by Benicia Bergevin  MD,  Marcelino Campos ([id number]) on 02/12/2016 8:00:22 AM     Patient here complaining of flank pain without fever or vomiting or abdominal discomfort. Mild leukocytosis noted as well as urine with evidence of infection. Given dose of IV antibiotics here. Patient went to go home. Return precautions given  Lorre NickAnthony Faith Branan, MD 02/12/16 [phone]

## 2016-02-13 LAB — URINE CULTURE

## 2016-02-27 ENCOUNTER — Encounter (HOSPITAL_COMMUNITY): Payer: Self-pay

## 2016-02-27 ENCOUNTER — Emergency Department (HOSPITAL_COMMUNITY): Payer: Medicare Other

## 2016-02-27 ENCOUNTER — Emergency Department (HOSPITAL_COMMUNITY)
Admission: EM | Admit: 2016-02-27 | Discharge: 2016-02-27 | Disposition: A | Discharge status: Home / Self Care | Payer: Medicare Other | Attending: Emergency Medicine | Admitting: Emergency Medicine

## 2016-02-27 DIAGNOSIS — N400 Enlarged prostate without lower urinary tract symptoms: Secondary | ICD-10-CM | POA: Diagnosis not present

## 2016-02-27 DIAGNOSIS — Z8673 Personal history of transient ischemic attack (TIA), and cerebral infarction without residual deficits: Secondary | ICD-10-CM | POA: Diagnosis not present

## 2016-02-27 DIAGNOSIS — Z7951 Long term (current) use of inhaled steroids: Secondary | ICD-10-CM | POA: Diagnosis not present

## 2016-02-27 DIAGNOSIS — I10 Essential (primary) hypertension: Secondary | ICD-10-CM | POA: Insufficient documentation

## 2016-02-27 DIAGNOSIS — Z792 Long term (current) use of antibiotics: Secondary | ICD-10-CM | POA: Insufficient documentation

## 2016-02-27 DIAGNOSIS — H409 Unspecified glaucoma: Secondary | ICD-10-CM | POA: Insufficient documentation

## 2016-02-27 DIAGNOSIS — K5900 Constipation, unspecified: Secondary | ICD-10-CM | POA: Diagnosis present

## 2016-02-27 DIAGNOSIS — Z79899 Other long term (current) drug therapy: Secondary | ICD-10-CM | POA: Insufficient documentation

## 2016-02-27 DIAGNOSIS — E785 Hyperlipidemia, unspecified: Secondary | ICD-10-CM | POA: Insufficient documentation

## 2016-02-27 DIAGNOSIS — Z87891 Personal history of nicotine dependence: Secondary | ICD-10-CM | POA: Diagnosis not present

## 2016-02-27 DIAGNOSIS — J449 Chronic obstructive pulmonary disease, unspecified: Secondary | ICD-10-CM | POA: Insufficient documentation

## 2016-02-27 DIAGNOSIS — Z7952 Long term (current) use of systemic steroids: Secondary | ICD-10-CM | POA: Insufficient documentation

## 2016-02-27 LAB — CBC WITH DIFFERENTIAL/PLATELET
BASOS ABS: 0 10*3/uL (ref 0.0–0.1)
BASOS PCT: 0 %
Eosinophils Absolute: 0 10*3/uL (ref 0.0–0.7)
Eosinophils Relative: 0 %
HEMATOCRIT: 38.8 % — AB (ref 39.0–52.0)
HEMOGLOBIN: 13 g/dL (ref 13.0–17.0)
Lymphocytes Relative: 7 %
Lymphs Abs: 0.4 10*3/uL — ABNORMAL LOW (ref 0.7–4.0)
MCH: 27.5 pg (ref 26.0–34.0)
MCHC: 33.5 g/dL (ref 30.0–36.0)
MCV: 82 fL (ref 78.0–100.0)
Monocytes Absolute: 0.2 10*3/uL (ref 0.1–1.0)
Monocytes Relative: 3 %
NEUTROS ABS: 5.8 10*3/uL (ref 1.7–7.7)
NEUTROS PCT: 90 %
Platelets: 179 10*3/uL (ref 150–400)
RBC: 4.73 MIL/uL (ref 4.22–5.81)
RDW: 14.5 % (ref 11.5–15.5)
WBC: 6.5 10*3/uL (ref 4.0–10.5)

## 2016-02-27 LAB — URINALYSIS, ROUTINE W REFLEX MICROSCOPIC
Bilirubin Urine: NEGATIVE
Glucose, UA: NEGATIVE mg/dL
Ketones, ur: NEGATIVE mg/dL
NITRITE: NEGATIVE
PH: 7.5 (ref 5.0–8.0)
Protein, ur: 100 mg/dL — AB
SPECIFIC GRAVITY, URINE: 1.016 (ref 1.005–1.030)

## 2016-02-27 LAB — COMPREHENSIVE METABOLIC PANEL
ALBUMIN: 3.8 g/dL (ref 3.5–5.0)
ALK PHOS: 92 U/L (ref 38–126)
ALT: 17 U/L (ref 17–63)
ANION GAP: 9 (ref 5–15)
AST: 25 U/L (ref 15–41)
BUN: 23 mg/dL — ABNORMAL HIGH (ref 6–20)
CALCIUM: 8.8 mg/dL — AB (ref 8.9–10.3)
CO2: 24 mmol/L (ref 22–32)
Chloride: 101 mmol/L (ref 101–111)
Creatinine, Ser: 1.32 mg/dL — ABNORMAL HIGH (ref 0.61–1.24)
GFR calc non Af Amer: 44 mL/min — ABNORMAL LOW (ref >60)
GFR, EST AFRICAN AMERICAN: 52 mL/min — AB (ref >60)
Glucose, Bld: 207 mg/dL — ABNORMAL HIGH (ref 65–99)
Potassium: 4.3 mmol/L (ref 3.5–5.1)
SODIUM: 134 mmol/L — AB (ref 135–145)
TOTAL PROTEIN: 7.3 g/dL (ref 6.5–8.1)
Total Bilirubin: 1.8 mg/dL — ABNORMAL HIGH (ref 0.3–1.2)

## 2016-02-27 LAB — URINE MICROSCOPIC-ADD ON

## 2016-02-27 LAB — LIPASE, BLOOD: Lipase: 15 U/L (ref 11–51)

## 2016-02-27 MED ORDER — IOPAMIDOL (ISOVUE-300) INJECTION 61%
100.0000 mL | Freq: Once | INTRAVENOUS | Status: AC | PRN
  Administered 2016-02-27: 80 mL via INTRAVENOUS

## 2016-02-27 MED ORDER — SORBITOL 70 % SOLN
960.0000 mL | TOPICAL_OIL | Freq: Once | ORAL | Status: AC
  Administered 2016-02-27: 960 mL via RECTAL
  Filled 2016-02-27: qty 240

## 2016-02-27 MED ORDER — DIATRIZOATE MEGLUMINE & SODIUM 66-10 % PO SOLN
15.0000 mL | Freq: Once | ORAL | Status: AC
  Administered 2016-02-27: 15 mL via ORAL

## 2016-02-27 NOTE — Progress Notes (Signed)
EDCM went to speak to patient's son at bedside however, he was not at bedside.  EDCM attempted to call patient's son x1 without success.

## 2016-02-27 NOTE — ED Notes (Signed)
Bed: WA21 Expected date:  Expected time:  Means of arrival:  Comments: 3F/fall/back pain

## 2016-02-27 NOTE — ED Notes (Signed)
Patient's son pulled linen and pads from the bed and threw on the floor, stating, "I am not going to have my daddy go to the grave with doo doo all over him." writer explained that we were waiting for patient to expel enema and stool and then clean everything up. Additional pads placed under patient per Clinical research associatewriter. Patient currently expelling loose stool at this time.

## 2016-02-27 NOTE — Care Management Note (Signed)
Case Management Note  Patient Details  Name: Jesus Carey MRN: [id number] Date of Birth: October 18, 1919  Subjective/Objective:      82 year old male presents to Ed with constipation.  Patient with pmhx of COPD, has chronic foley catheter.                 Action/Plan: Discussed home health services with patient's son per patient' request.  Patient's son has requested [facility] for services.  Patient's son denies need for dme for patient at this time.  Patient's son lives with the patient.  Patient's son reports that patient sleeps with leg bag attachment with foley catheter and reports it has to be emptied 4-5 times a night and often the bed sheets need to be changed.  Patient's son reports he has the large drainage bag at home, does not know how to connect it.  EDCM asked EDRN to educate patient's son on how to attach large drainage bag.  Discussed patient with EDPA who placed home health orders for RN, PT, OT aide and SW.  Patient's son thankful for services.  [facility].   Expected Discharge Date:                  Expected Discharge Plan:  Home w Home Health Services  In-House Referral:     Discharge planning Services  CM Consult  Post Acute Care Choice:    Choice offered to:  Patient, Adult Children  DME Arranged:   (none neede per patient's son Pension scheme managerLateefe) DME Agency:     HH Arranged:  RN, Disease Management, OT, Nurse's Aide, Social Work ,PT HH Agency:  [facility]  Status of Service:  Completed, signed off  Medicare Important Message Given:    Date Medicare IM Given:    Medicare IM give by:    Date Additional Medicare IM Given:    Additional Medicare Important Message give by:     If discussed at Long Length of Stay Meetings, dates discussed:    Additional CommentsRadford Pax:  Jennet Scroggin, RN 02/27/2016, 7:35 PM

## 2016-02-27 NOTE — ED Provider Notes (Signed)
Patient presents to the emergency room with complaints of lower abdominal pain and inability to have a normal bowel movement. He tried magnesium citrate without relief. Physical Exam  BP 106/75 mmHg  Pulse 101  Temp(Src) 97.6 F (36.4 C) (Oral)  Resp 14  Ht 5\' 7"  (1.702 m)  Wt 40.37 kg  BMI 13.94 kg/m2  SpO2 97%  Physical Exam  Constitutional: He appears well-developed and well-nourished. No distress.  HENT:  Head: Normocephalic and atraumatic.  Right Ear: External ear normal.  Left Ear: External ear normal.  Eyes: Conjunctivae are normal. Right eye exhibits no discharge. Left eye exhibits no discharge. No scleral icterus.  Neck: Neck supple. No tracheal deviation present.  Cardiovascular: Normal rate.   Pulmonary/Chest: Effort normal. No stridor. No respiratory distress.  Abdominal: He exhibits no distension and no mass. There is tenderness (suprapubic area). There is no rebound and no guarding.  Scaphoid abdomen  Musculoskeletal: He exhibits no edema.  Neurological: He is alert. Cranial nerve deficit: no gross deficits.  Skin: Skin is warm and dry. No rash noted.  Psychiatric: He has a normal mood and affect.  Nursing note and vitals reviewed.   ED Course  Procedures  MDM Patient presents emergent with complaints of lower abdominal pain and constipation. On exam he has tenderness in his lower abdomen. Plan on labs, rectal exam and a CT scan to evaluate further.  UA likely contamination from indwelling catheter.  CT scan without infection or obstruction.  Will dc home with treatment for constipation.    Linwood DibblesJon Savita Runner, MD 02/27/16 [phone]

## 2016-02-27 NOTE — Discharge Instructions (Signed)
Read the information below.  Take miralax once daily for the next few days. You can also try OTC docusate for relief of constipation. Be sure to follow up with your PCP in the next three days for re-evaluation and ensure that you are doing better. You may return to the Emergency Department at any time for worsening condition or any new symptoms that concern you. Return to the ED if your symptoms worsen or develop fever, chills, night sweats, or bloody stool.    Constipation, Adult Constipation is when a person:  Poops (has a bowel movement) less than 3 times a week.  Has a hard time pooping.  Has poop that is dry, hard, or bigger than normal. HOME CARE   Eat foods with a lot of fiber in them. This includes fruits, vegetables, beans, and whole grains such as brown rice.  Avoid fatty foods and foods with a lot of sugar. This includes french fries, hamburgers, cookies, candy, and soda.  If you are not getting enough fiber from food, take products with added fiber in them (supplements).  Drink enough fluid to keep your pee (urine) clear or pale yellow.  Exercise on a regular basis, or as told by your doctor.  Go to the restroom when you feel like you need to poop. Do not hold it.  Only take medicine as told by your doctor. Do not take medicines that help you poop (laxatives) without talking to your doctor first. GET HELP RIGHT AWAY IF:   You have bright red blood in your poop (stool).  Your constipation lasts more than 4 days or gets worse.  You have belly (abdominal) or butt (rectal) pain.  You have thin poop (as thin as a pencil).  You lose weight, and it cannot be explained. MAKE SURE YOU:   Understand these instructions.  Will watch your condition.  Will get help right away if you are not doing well or get worse.   This information is not intended to replace advice given to you by your health care provider. Make sure you discuss any questions you have with your health  care provider.   Document Released: 03/17/2008 Document Revised: 10/20/2014 Document Reviewed: 07/11/2013 Elsevier Interactive Patient Education [employer]2016 Elsevier Inc.

## 2016-02-27 NOTE — Progress Notes (Addendum)
Patient listed as being seen in the ED 11 times within the last six months.  EDCM went to speak to patient at bedside x 2, however, RN performing procedure.    02/27/2016 1708pm  EDCM went to peak to patient at bedside, however he reports [facility].  Discussed with EDRN.

## 2016-02-27 NOTE — ED Notes (Signed)
Patient expelling small amounts of enema and liquid stool. Bed padded. Will check on patient and change bed later.

## 2016-02-27 NOTE — ED Notes (Signed)
Jesus Carey/son    [phone] Call before discharge.

## 2016-02-27 NOTE — ED Provider Notes (Signed)
CSN: [id number]     Arrival date & time 02/27/16  1050 History   First MD Initiated Contact with Patient 02/27/16 1142     Chief Complaint  Patient presents with  . Constipation     (Consider location/radiation/quality/duration/timing/severity/associated sxs/prior Treatment) HPI Comments: Jesus Carey is a 82 y.o. male with history of HTN, COPD, constipation, hyperlipidemia, and CVA presents to ED with complaint of constipation. Patient and son endorse last bowel movement approximately 1.5 weeks ago. Patient has tried citrate magnesium without relief, last dose yesterday. Patient endorses passing flatus. Associated symptoms include abdominal pain. Patient has catheter placed. No dysuria, hematuria, or malodorous urine. Denies fever, chills, night sweats. No nausea or vomiting. Chronically short of breath secondary to COPD. No chest pain. No history of abdominal surgeries or abdominal conditions. No recent changes to medications. No opioid medications.   Patient is a 82 y.o. male presenting with constipation. The history is provided by the patient and a relative.  Constipation Associated symptoms: abdominal pain   Associated symptoms: no dysuria, no fever, no nausea and no vomiting     Past Medical History  Diagnosis Date  . Hypertension   . Glaucoma   . Emphysema lung (HCC)   . COPD (chronic obstructive pulmonary disease) (HCC)   . CVA (cerebral infarction)   . BPH (benign prostatic hyperplasia)    No past surgical history on file. No family history on file. Social History  Substance Use Topics  . Smoking status: Former Smoker -- 0.50 packs/day for 20 years    Types: Cigarettes    Quit date: 04/26/1995  . Smokeless tobacco: Never Used  . Alcohol Use: No    Review of Systems  Constitutional: Negative for fever, chills and diaphoresis.  HENT: Negative for sore throat and trouble swallowing.   Respiratory: Positive for shortness of breath. Stridor:  Chronically, secondary to  COPD.   Cardiovascular: Negative for chest pain.  Gastrointestinal: Positive for abdominal pain and constipation. Negative for nausea, vomiting and blood in stool.  Genitourinary: Negative for dysuria and hematuria.  Musculoskeletal: Negative for neck pain and neck stiffness.  Skin: Negative for rash.  Neurological: Negative for weakness and numbness.      Allergies  Review of patient's allergies indicates no known allergies.  Home Medications   Prior to Admission medications   Medication Sig Start Date End Date Taking? Authorizing Provider  amLODipine (NORVASC) 10 MG tablet Take 1 tablet (10 mg total) by mouth daily. 10/22/15  Yes Roxy Horseman, PA-C  brimonidine (ALPHAGAN) 0.15 % ophthalmic solution Place 1 drop into both eyes 3 (three) times daily.  10/30/15  Yes Historical Provider, MD  brinzolamide (AZOPT) 1 % ophthalmic suspension Place 1 drop into both eyes 3 (three) times daily.    Yes Historical Provider, MD  finasteride (PROSCAR) 5 MG tablet Take 5 mg by mouth daily. 12/27/15  Yes Historical Provider, MD  ipratropium-albuterol (DUONEB) 0.5-2.5 (3) MG/3ML SOLN Take 3 mLs by nebulization 4 (four) times daily. 11/30/15  Yes Coralyn Helling, MD  predniSONE (DELTASONE) 10 MG tablet take 1 tablet by mouth once daily with BREAKFAST 10/31/15  Yes Michele Mcalpine, MD  rosuvastatin (CRESTOR) 10 MG tablet Take 1 tablet (10 mg total) by mouth daily. 10/22/15  Yes Roxy Horseman, PA-C  TRAVATAN Z 0.004 % SOLN ophthalmic solution Place 1 drop into both eyes daily.  10/31/15  Yes Historical Provider, MD  cephALEXin (KEFLEX) 500 MG capsule Take 1 capsule (500 mg total) by mouth 2 (two) times  daily. Patient not taking: Reported on 02/12/2016 01/12/16   Rolan Bucco, MD  cephALEXin (KEFLEX) 500 MG capsule Take 1 capsule (500 mg total) by mouth 2 (two) times daily. Patient not taking: Reported on 02/27/2016 02/12/16   Rolm Gala Barrett, PA-C  diclofenac sodium (VOLTAREN) 1 % GEL Apply 2 g topically 4 (four) times  daily. Patient not taking: Reported on 01/12/2016 09/01/15   Charm Rings, MD  tamsulosin (FLOMAX) 0.4 MG CAPS capsule Take 1 capsule (0.4 mg total) by mouth daily. Patient not taking: Reported on 01/12/2016 05/12/15   Maryruth Bun Rama, MD   BP 122/72 mmHg  Pulse 82  Temp(Src) 97.7 F (36.5 C) (Oral)  Resp 16  Ht 5\' 7"  (1.702 m)  Wt 40.37 kg  BMI 13.94 kg/m2  SpO2 95% Physical Exam  Constitutional: He appears cachectic. No distress.  HENT:  Head: Normocephalic and atraumatic.  Mouth/Throat: Oropharynx is clear and moist. No oropharyngeal exudate.  Eyes: Conjunctivae and EOM are normal. Pupils are equal, round, and reactive to light. Right eye exhibits no discharge. Left eye exhibits no discharge. No scleral icterus.  Neck: Neck supple.  Cardiovascular: Normal rate, regular rhythm, normal heart sounds and intact distal pulses.   No murmur heard. Pulmonary/Chest: Effort normal and breath sounds normal. No respiratory distress.  Abdominal: Soft. Bowel sounds are normal. There is tenderness ( Suprapubic). There is no rebound, no guarding and no CVA tenderness.  Musculoskeletal: Normal range of motion. He exhibits no edema.  Lymphadenopathy:    He has no cervical adenopathy.  Neurological: He is alert.  Skin: Skin is warm and dry. He is not diaphoretic.  Psychiatric: He has a normal mood and affect. His behavior is normal.    ED Course  Procedures (including critical care time) Labs Review Labs Reviewed  CBC WITH DIFFERENTIAL/PLATELET - Abnormal; Notable for the following:    HCT 38.8 (*)    Lymphs Abs 0.4 (*)    All other components within normal limits  URINALYSIS, ROUTINE W REFLEX MICROSCOPIC (NOT AT [facility]) - Abnormal; Notable for the following:    APPearance TURBID (*)    Hgb urine dipstick SMALL (*)    Protein, ur 100 (*)    Leukocytes, UA LARGE (*)    All other components within normal limits  COMPREHENSIVE METABOLIC PANEL - Abnormal; Notable for the following:    Sodium  134 (*)    Glucose, Bld 207 (*)    BUN 23 (*)    Creatinine, Ser 1.32 (*)    Calcium 8.8 (*)    Total Bilirubin 1.8 (*)    GFR calc non Af Amer 44 (*)    GFR calc Af Amer 52 (*)    All other components within normal limits  URINE MICROSCOPIC-ADD ON - Abnormal; Notable for the following:    Squamous Epithelial / LPF 0-5 (*)    Bacteria, UA MANY (*)    All other components within normal limits  URINE CULTURE  LIPASE, BLOOD    Imaging Review Ct Abdomen Pelvis W Contrast  02/27/2016  CLINICAL DATA:  82 year old with inability to have a bowel movement for 1.5 weeks. Weight loss. EXAM: CT ABDOMEN AND PELVIS WITH CONTRAST TECHNIQUE: Multidetector CT imaging of the abdomen and pelvis was performed using the standard protocol following bolus administration of intravenous contrast. CONTRAST:  80mL ISOVUE-300 IOPAMIDOL (ISOVUE-300) INJECTION 61% COMPARISON:  Radiographs 08/13/2015.  CT 08/01/2004 FINDINGS: Lower chest: Chronic lung disease with emphysema and bronchiectasis in both lower lobes. There are patchy  peribronchial airspace opacities in both lower lobes suspicious for chronic aspiration. The heart size is normal. There is a small hiatal hernia. No significant pleural effusion. Hepatobiliary: No suspicious hepatic findings. Probable incidental communication between the left portal vein and middle hepatic vein peripherally on image 16. There are small low-density lesions in the dome of the liver, probably cysts. No evidence of gallstones, gallbladder wall thickening or biliary dilatation. Pancreas: Mildly atrophied without focal abnormality or surrounding inflammation. Spleen: Normal in size without focal abnormality. Adrenals/Urinary Tract: Both adrenal glands appear normal. The right kidney demonstrates a 4.6 x 2.5 cm cyst in its lower interpolar region. There is a nonobstructing calculus in the upper pole the right kidney. There are multiple caliceal calculi in the lower pole and interpolar region  of the left kidney. Additional tiny low-density renal lesions are present bilaterally, likely cysts. No evidence of ureteral calculus or hydronephrosis. The urinary bladder is heavily trabeculated with several calculi on the right. Foley catheter is in place. Stomach/Bowel: No evidence of bowel wall thickening, distention or surrounding inflammatory change. There is a large and a stool throughout the colon, especially within the rectum. The appendix appears normal. Vascular/Lymphatic: There are no enlarged abdominal or pelvic lymph nodes. Relatively mild aortic and branch vessel atherosclerosis for age. Reproductive: The prostate gland is moderately enlarged, measuring 3.7 x 4.8 x 6.9 cm. It is mildly heterogeneous. Other: No evidence of abdominal wall mass or hernia. Musculoskeletal: No acute or significant osseous findings. The bones are demineralized. There are degenerative changes in the lumbar spine and both hips. IMPRESSION: 1. Prominent stool throughout the colon with probable fecal impaction of the rectum. No evidence of bowel obstruction, inflammation or perforation. 2. Heavily trabeculated bladder, likely from prostatomegaly and chronic bladder outlet obstruction. There are multiple bladder calculi. 3. Multiple renal calculi, more numerous on the left. No evidence of ureteral calculus. 4. Bronchiectasis and peribronchial opacities at both lung bases suspicious for chronic aspiration. Electronically Signed   By: Carey Bullocks M.D.   On: 02/27/2016 14:35   I have personally reviewed and evaluated these images and lab results as part of my medical decision-making.   EKG Interpretation None      MDM   Final diagnoses:  Constipation, unspecified constipation type    Jesus Carey is a 82 y.o. male presents to ED with complaint of constipation. Last bowel movement 1.5 weeks ago. Passing flatus. Patient is afebrile and cachectic in appearance. Heart rate slightly elevated, vital signs otherwise  stable. Exam remarkable;e for tenderness in suprapubic region. Check CBC , CMP, lipase, UA, CT abdomen to r/o underlying pathology.   CBC unremarkable. Lipase normal. CMP shows mildly low Na, mildly elevated creatinine. Review of records show h/o mildly elevated creatinine. U/A remarkable for bacturia. Review of previous records show chronic leukocytes, bacteria, and WBCs in urine. Patient has chronic indwelling foley catheter. Will culture and hold off on antibiotics at this time.   CT remarkable for stool burden in colon with possible impaction. No obstruction noted. Calculi found in bladder and kidneys. Bronchiecatasis and opacities at lung bases concerning for chronic aspiration. Discussed CT results with patient.  3:20 PM: Attempted manual disimpaction with chaperone present. Stool is soft, high in the rectal vault, and difficult to remove. Will give enema.   6:06 PM: Nursing states patient expelling stool following enema.   Discussed results of CT with patient and son. Provided home instructions for constipation management to include miralax and docusate. Discussed importance of following up  with PCP in the next three days for re-evaluation. Home health consult placed for co-morbid medical conditions, chronic foley catheter, and constipation. Return precautions provided. Patient and son voiced understanding and are agreeable.    Lona Kettleshley Laurel Meyer, PA-C 02/27/16 2023  Linwood DibblesJon Knapp, MD 11/17/2015 [phone]

## 2016-02-27 NOTE — ED Notes (Signed)
Patient had 4 incontinent loose stools prior to final discharge. Patient changed into paper scrubs. Na anincontinet pad given tot he son to place in the car seat in case of an accident .

## 2016-02-27 NOTE — ED Notes (Signed)
He c/o unable to have b.m. X 1 1/2 weeks, in spite of drinking two bottles of mag citrate.  He is in no distress.  He is cachectic in appearance.

## 2016-02-28 LAB — URINE CULTURE: CULTURE: NO GROWTH

## 2016-03-13 DEATH — deceased

## 2016-04-14 ENCOUNTER — Ambulatory Visit: Payer: Medicare Other | Admitting: Pulmonary Disease
